# Patient Record
Sex: Male | Born: 1970 | Race: White | Hispanic: No | Marital: Married | Smoking: Never smoker
Health system: Southern US, Community
[De-identification: ages and names within clinical notes are randomized; demographics above are authoritative.]

## PROBLEM LIST (undated history)

## (undated) DIAGNOSIS — R5383 Other fatigue: Secondary | ICD-10-CM

## (undated) HISTORY — PX: MOUTH SURGERY: SHX715

## (undated) HISTORY — PX: WRIST SURGERY: SHX841

## (undated) HISTORY — DX: Other fatigue: R53.83

---

## 2003-01-15 HISTORY — PX: HERNIA REPAIR: SHX51

## 2013-01-14 HISTORY — PX: FOOT SURGERY: SHX648

## 2015-09-10 ENCOUNTER — Encounter (HOSPITAL_COMMUNITY): Payer: Self-pay | Admitting: Emergency Medicine

## 2015-09-10 ENCOUNTER — Ambulatory Visit (HOSPITAL_COMMUNITY)
Admission: EM | Admit: 2015-09-10 | Discharge: 2015-09-10 | Disposition: A | Discharge status: Home / Self Care | Payer: BC Managed Care – PPO | Attending: Nurse Practitioner | Admitting: Nurse Practitioner

## 2015-09-10 DIAGNOSIS — S0101XA Laceration without foreign body of scalp, initial encounter: Secondary | ICD-10-CM
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
## 2015-09-10 NOTE — ED Triage Notes (Signed)
[facility] a [facility] of a laceration to his head that occurred today secondary to hitting it with a piece of metal. The patient stated that he had a TDAP booster about 2 years ago.

## 2015-09-29 ENCOUNTER — Other Ambulatory Visit: Payer: Self-pay | Admitting: Family Medicine

## 2015-09-29 ENCOUNTER — Ambulatory Visit
Admission: RE | Admit: 2015-09-29 | Discharge: 2015-09-29 | Disposition: A | Discharge status: Home / Self Care | Payer: BC Managed Care – PPO | Source: Ambulatory Visit | Attending: Family Medicine | Admitting: Family Medicine

## 2015-09-29 DIAGNOSIS — M25561 Pain in right knee: Secondary | ICD-10-CM

## 2015-10-06 ENCOUNTER — Other Ambulatory Visit: Payer: Self-pay | Admitting: Family Medicine

## 2015-10-06 DIAGNOSIS — R413 Other amnesia: Secondary | ICD-10-CM

## 2015-10-26 ENCOUNTER — Other Ambulatory Visit: Payer: BC Managed Care – PPO

## 2015-11-29 ENCOUNTER — Ambulatory Visit: Payer: BC Managed Care – PPO | Admitting: Neurology

## 2015-12-25 ENCOUNTER — Ambulatory Visit (INDEPENDENT_AMBULATORY_CARE_PROVIDER_SITE_OTHER): Payer: BC Managed Care – PPO | Admitting: Neurology

## 2015-12-25 ENCOUNTER — Encounter: Payer: Self-pay | Admitting: Neurology

## 2015-12-25 VITALS — BP 122/78 | HR 59 | Ht 71.0 in | Wt 192.4 lb

## 2015-12-25 DIAGNOSIS — G4761 Periodic limb movement disorder: Secondary | ICD-10-CM | POA: Diagnosis not present

## 2015-12-25 DIAGNOSIS — R5382 Chronic fatigue, unspecified: Secondary | ICD-10-CM | POA: Diagnosis not present

## 2015-12-25 DIAGNOSIS — F05 Delirium due to known physiological condition: Secondary | ICD-10-CM | POA: Diagnosis not present

## 2015-12-25 DIAGNOSIS — G4719 Other hypersomnia: Secondary | ICD-10-CM | POA: Diagnosis not present

## 2015-12-25 DIAGNOSIS — R4189 Other symptoms and signs involving cognitive functions and awareness: Secondary | ICD-10-CM

## 2015-12-25 DIAGNOSIS — R419 Unspecified symptoms and signs involving cognitive functions and awareness: Secondary | ICD-10-CM | POA: Diagnosis not present

## 2015-12-25 NOTE — Progress Notes (Signed)
[facility]    Provider:  Dr Lucia GaskinsAhern Referring Provider: Tally JoeSwayne, David, MD Primary Care Physician:  Sissy HoffSWAYNE,DAVID W, MD  CC:  Memory loss  HPI:  Steven Daugherty is a 45 y.o. male here as a referral from Dr. Azucena CecilSwayne for memory loss. No other significant past medical history. Denies depression, anxiety. He has had episodes "on and off" for years. 6 years ago he had an awkward episode and for about 10 minutes he couldn't remember her name. Since then he has major "blank outs". He was at a party and for a few minutes he couldn't remember her name, he had to avoid her because he forgot her name and then he remembered but no loss of consciousness, no confusion or alteration of awareness. Recently he was standing in front of a hammock and he couldn't remember the name, he eventually remembered it. Happening more frequently, the last 2 episodes were a month apart. No drug use. No alcohol at the events but he has a beer in the evening and in the weekend. No new medications. He has difficulty sleeping, he is bed for 6-7 hours but he feels like he only sleeps a short period. He goes to be at 1030pm and wakes at 6am, he usually fall asleep quickly, doesn't feel refreshed in the morning, not snoring. Sleep issues off and on for 7-8 years. He is faigued during the day, he denies morning headache. He has changed mattresses, sleeps in the dark, no abnormal movements at night. He forgets something as soon as he is told. He did very well in school. He tries very hard to pay attention. 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 No smoking. No hea dtrauma in the past or any inciting events. No dementia in the family. No FHx of autoimmune disorders or multiple sclerosis. He moves a lot at night, moving his legs a lot. He has good sleep habits and has tried very hard to do things like making the room dark. Keeping the  temperature cool, no alcohol evenings, not eating late and regular sleep habits.   Reviewed notes, labs and imaging from outside physicians, which showed:  CBC normal, CMP normal with BUN 12 and creatinine 0.82. 09/2015  B12 382, TSH 1.56  Reviewed primary care notes. Patient reported trouble sleeping, feeling fatigued and tired all the time, he attributed his memory loss possibly due to his sleeping issues. He reported occasionally temporary forgetfulness of names or people or things. Forgetting names of objects for several minutes. He reported excessive daytime fatigue. He tried making the room dark, bed and was comfortable, her temperature comfortable. He has also tried drinking enough water, no alcohol on the evenings, not eating late, avoiding salt and sugar and caffeine. He feels his stress levels very low, and he gets off the computer screen  at bedtime. A suggestion of a trial of over-the-counter melatonin. He also reported progressive memory loss over 3 years. Not remembering what the name of the town he is in. Could not remember the name of his wife for 15 minutes. Losing names of his good friends. No usage of alcohol or illicit drugs. Long-standing history of fatigue for many years. Worse in the afternoons.    Review of Systems: Patient complains of symptoms per HPI as well as the following symptoms: memory loss, slurred speech, insomnia, fatigue. Pertinent negatives per HPI. All others negative.   Social  History   Social History  . Marital status: Married    Spouse name: Steven Daugherty  . Number of children: 0  . Years of education: 20   Occupational History  . [employer]    Social History Main Topics  . Smoking status: Never Smoker  . Smokeless tobacco: Never Used  . Alcohol use 0.6 oz/week    1 Standard drinks or equivalent per week     Comment: daily- 1 drink  . Drug use: No  . Sexual activity: Not on file   Other Topics Concern  . Not on file   Social History Narrative    Lives with wife   Caffeine use: 2 cups/day- coffee    Family History  Problem Relation Age of Onset  . Dementia Neg Hx     Past Medical History:  Diagnosis Date  . Fatigue     Past Surgical History:  Procedure Laterality Date  . FOOT SURGERY  2015   Arthritis  . HERNIA REPAIR  2005  . 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   No current facility-administered medications for this visit.     Allergies as of 12/25/2015  . (No Known Allergies)    Vitals: BP 122/78 (BP Location: Right Arm, Patient Position: Sitting, Cuff Size: Normal)   Pulse (!) 59   Ht 5\' 11"  (1.803 m)   Wt 192 lb 6.4 oz (87.3 kg)   BMI 26.83 kg/m  Last Weight:  Wt Readings from Last 1 Encounters:  12/25/15 192 lb 6.4 oz (87.3 kg)   Last Height:   Ht Readings from Last 1 Encounters:  12/25/15 5\' 11"  (1.803 m)   Physical exam: Exam: Gen: NAD, conversant, well nourised, obese, well groomed                     CV: RRR, no MRG. No Carotid Bruits. No peripheral edema, warm, nontender Eyes: Conjunctivae clear without exudates or hemorrhage  Neuro: Detailed Neurologic Exam  Speech:    Speech is normal; fluent and spontaneous with normal comprehension.  Cognition:  MMSE - Mini Mental State Exam 12/25/2015  Orientation to time 5  Orientation to Place 5  Registration 3  Attention/ Calculation 5  Recall 3  Language- name 2 objects 2  Language- repeat 1  Language- follow 3 step command 3  Language- read & follow direction 1  Write a sentence 1  Copy design 1  Total score 30      The patient is oriented to person, place, and time;     recent and remote memory intact;     language fluent;     normal attention, concentration,     fund of knowledge Cranial Nerves:    The pupils are equal, round, and reactive to light. The fundi are normal and spontaneous venous pulsations are present. Visual fields are full to finger  confrontation. Extraocular movements are intact. Trigeminal sensation is intact and the muscles of mastication are normal. The face is symmetric. The palate elevates in the midline. Hearing intact. Voice is normal. Shoulder shrug is normal. The tongue has normal motion without fasciculations.   Coordination:    Normal finger to nose and heel to shin. Normal rapid alternating movements.   Gait:    Heel-toe and tandem gait are normal.   Motor Observation:    No asymmetry, no atrophy, and no involuntary movements noted. Tone:    Normal  muscle tone.    Posture:    Posture is normal. normal erect    Strength:    Strength is V/V in the upper and lower limbs.      Sensation: intact to LT     Reflex Exam:  DTR's:    Deep tendon reflexes in the upper and lower extremities are normal bilaterally.   Toes:    The toes are downgoing bilaterally.   Clonus:    Clonus is absent.       Assessment/Plan:  45 year old male with no significant past medical history reporting excessive daytime fatigue and memory changes. Denies any mood disorder.  Excessive daytime fatigue, He also moves a lot at night, moving his legs a lot. He has good sleep habits and has tried very hard to do things like making the room dark. Keeping the temperature cool, no alcohol evenings, not eating late and regular sleep habits. Still he reports never feeling rested. Epworth sleepiness scale 12. We'll refer for sleep evaluation.  Memory loss: MRI of the brain. Likely due to his extreme fatigue.   Cc: Sissy HoffSWAYNE,DAVID W, MD  Naomie DeanAntonia Ahern, MD  [facility] [address] [place], [place] [id number]  Phone [phone] Fax [phone]

## 2015-12-25 NOTE — Patient Instructions (Signed)
Remember to drink plenty of fluid, eat healthy meals and do not skip any meals. Try to eat protein with a every meal and eat a healthy snack such as fruit or nuts in between meals. Try to keep a regular sleep-wake schedule and try to exercise daily, particularly in the form of walking, 20-30 minutes a day, if you can.   As far as diagnostic testing: sleep evaluation for PLMS or other sleep disorder, MRI brain  I would like to see you back in 4-6 months, sooner if we need to. Please call us with any interim questions, concerns, problems, updates or refill requests.   Our phone number is [phone]. We also have an after hours call service for urgent matters and there is a physician on-call for urgent questions. For any emergencies you know to call 911 or go to the nearest emergency room

## 2015-12-26 ENCOUNTER — Encounter: Payer: Self-pay | Admitting: Neurology

## 2015-12-26 DIAGNOSIS — R5383 Other fatigue: Secondary | ICD-10-CM | POA: Insufficient documentation

## 2015-12-26 DIAGNOSIS — R419 Unspecified symptoms and signs involving cognitive functions and awareness: Secondary | ICD-10-CM | POA: Insufficient documentation

## 2016-01-20 ENCOUNTER — Inpatient Hospital Stay: Admission: RE | Admit: 2016-01-20 | Payer: BC Managed Care – PPO | Source: Ambulatory Visit

## 2016-02-08 ENCOUNTER — Ambulatory Visit (INDEPENDENT_AMBULATORY_CARE_PROVIDER_SITE_OTHER): Payer: BC Managed Care – PPO | Admitting: Neurology

## 2016-02-08 ENCOUNTER — Encounter: Payer: Self-pay | Admitting: Neurology

## 2016-02-08 VITALS — BP 118/78 | HR 58 | Resp 20 | Ht 71.0 in | Wt 194.0 lb

## 2016-02-08 DIAGNOSIS — G4719 Other hypersomnia: Secondary | ICD-10-CM | POA: Diagnosis not present
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
## 2016-02-19 ENCOUNTER — Telehealth: Payer: Self-pay

## 2016-02-19 NOTE — Telephone Encounter (Signed)
I called pt to discuss his HLA testing results. (negative-normal).  No answer, left a message asking him to call me back.

## 2016-02-27 NOTE — Telephone Encounter (Signed)
Pt returned my call. I advised him that his HLA test came back negative (normal.) Pt will proceed with sleep studies as discussed and ordered. Pt verbalized understanding of results. Pt had no questions at this time but was encouraged to call back if questions arise.

## 2016-03-04 ENCOUNTER — Ambulatory Visit (INDEPENDENT_AMBULATORY_CARE_PROVIDER_SITE_OTHER): Payer: BC Managed Care – PPO | Admitting: Neurology

## 2016-03-04 DIAGNOSIS — G4710 Hypersomnia, unspecified: Secondary | ICD-10-CM

## 2016-03-04 DIAGNOSIS — G4719 Other hypersomnia: Secondary | ICD-10-CM

## 2016-03-11 ENCOUNTER — Telehealth: Payer: Self-pay | Admitting: Neurology

## 2016-03-11 DIAGNOSIS — G4733 Obstructive sleep apnea (adult) (pediatric): Secondary | ICD-10-CM

## 2016-03-11 DIAGNOSIS — R0683 Snoring: Secondary | ICD-10-CM

## 2016-03-11 DIAGNOSIS — G4710 Hypersomnia, unspecified: Secondary | ICD-10-CM

## 2016-03-11 DIAGNOSIS — G4730 Sleep apnea, unspecified: Principal | ICD-10-CM

## 2016-03-11 DIAGNOSIS — G4713 Recurrent hypersomnia: Secondary | ICD-10-CM

## 2016-03-11 NOTE — Telephone Encounter (Signed)
Will order CPAP titration to see how much apnea is contributing to his hypersomnia.
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
## 2016-03-12 NOTE — Telephone Encounter (Signed)
LM to call back for results

## 2016-03-12 NOTE — Telephone Encounter (Signed)
Please place titration order. Thank you.   I spoke to patient and he is aware of results and recommendations. He is willing to do titration study. I have sent copy of report to PCP.

## 2016-03-12 NOTE — Telephone Encounter (Signed)
Pt returned RN's call °

## 2016-03-12 NOTE — Telephone Encounter (Signed)
-----   Message from Melvyn Novasarmen Dohmeier, MD sent at 03/11/2016 12:25 PM EST ----- 1. Mild- Moderate degree of  Obstructive Sleep Apnea(OSA), this condition may be main cause of EDS as reported, but does not explain the cyclic pattern of occurrence.  OSA has been REM sleep accentuated.  2. Primary Snoring  RECOMMENDATIONS:  1. Advise full-night, attended, CPAP titration study to optimize therapy.  I ordered a return for CPAP titration and will re- evaluate the daytime sleepiness degree after 6 weeks of CPAP use.  2. Positional therapy is advised to reduce snoring. 3. Avoid sedative-hypnotics which may worsen sleep apnea, alcohol and tobacco (as applicable). 4. Advise patient to avoid driving or operating hazardous machinery when sleepy. 5. Further information regarding OSA may be obtained from [employer] (www.sleepfoundation.org) or American Sleep Apnea Association (www.sleepapnea.org). 6. A follow up appointment will be scheduled in the [facility]. The referring provider will be notified of the results.      I certify that I have reviewed the entire raw data recording prior to the issuance of this report in accordance with the Standards of Accreditation of the American Academy of Sleep Medicine (AASM)    Melvyn Novasarmen Dohmeier, MD  03-11-2016  Diplomat, American Board of Psychiatry and Neurology  Diplomat, American Board of Sleep Medicine Medical Director, [facility]

## 2016-03-13 NOTE — Addendum Note (Signed)
Addended by: Melvyn NovasHMEIER, Brianni Manthe on: 03/13/2016 05:27 PM   Modules accepted: Orders

## 2016-03-26 ENCOUNTER — Ambulatory Visit (INDEPENDENT_AMBULATORY_CARE_PROVIDER_SITE_OTHER): Payer: BC Managed Care – PPO | Admitting: Neurology

## 2016-03-26 DIAGNOSIS — G4733 Obstructive sleep apnea (adult) (pediatric): Secondary | ICD-10-CM | POA: Diagnosis not present

## 2016-03-26 DIAGNOSIS — G4713 Recurrent hypersomnia: Secondary | ICD-10-CM

## 2016-03-26 DIAGNOSIS — G4710 Hypersomnia, unspecified: Secondary | ICD-10-CM

## 2016-03-26 DIAGNOSIS — G4730 Sleep apnea, unspecified: Principal | ICD-10-CM

## 2016-03-26 DIAGNOSIS — R0683 Snoring: Secondary | ICD-10-CM

## 2016-03-29 NOTE — Addendum Note (Signed)
Addended by: Melvyn NovasHMEIER, Keefe Zawistowski on: 03/29/2016 12:24 PM   Modules accepted: Orders

## 2016-03-29 NOTE — Procedures (Signed)
PATIENT'S NAME:  Steven Daugherty, Steven Daugherty DOB:      12/25/1970      MR#:    [id number]     DATE OF RECORDING: 03/26/2016 REFERRING M.D.:  Naomie DeanAntonia Ahern MD Study Performed:   CPAP  Titration HISTORY:  Patient had [facility] baseline [facility] on 03/04/16 resulting in an AHI of 18.3/hour ,REM AHI was 24.8 /hour, supine AHI was 17.5. The Wake baseline sp02 saturation was 93%, with the lowest being 86%. Time spent below 89% saturation equaled 6 minutes.  Chef complaint Excessive daytime sleepiness and Fatigue.  The patient endorsed the Epworth Sleepiness Scale at 18/24 points .  The patient's weight 192 pounds with a height of 71 (inches), resulting in a BMI of 26.9 kg/m2.  The patient's neck circumference measured 16 inches.  CURRENT MEDICATIONS: None listed.    PROCEDURE:  This is a multichannel digital polysomnogram utilizing the SomnoStar 11.2 system.  Electrodes and sensors were applied and monitored per AASM Specifications.   EEG, EOG, Chin and Limb EMG, were sampled at 200 Hz.  ECG, Snore and Nasal Pressure, Thermal Airflow, Respiratory Effort, CPAP Flow and Pressure, Oximetry was sampled at 50 Hz. Digital video and audio were recorded.       CPAP was initiated at 5 cmH20 with heated humidity per AASM split night standards and pressure was advanced to 8/8cmH20 because of hypopneas, apneas and desaturations.  At a PAP pressure of 8 cmH20, there was a reduction of the AHI to 0 with improvement of the above symptoms of obstructive sleep apnea.    Lights Out was at 22:47 and Lights On at 05:03. Total recording time (TRT) was 376.5 minutes, with a total sleep time (TST) of 267 minutes. The patient's sleep latency was 64.5 minutes with 37 minutes of wake time after sleep onset. REM latency was 178 minutes.  The sleep efficiency was 70.9 %.    SLEEP ARCHITECTURE: WASO (Wake after sleep onset)  was 36.5 minutes.  There were 10.5 minutes in Stage N1, 215.5 minutes Stage N2, 0 minutes Stage N3 and 41  minutes in Stage REM.  The percentage of Stage N1 was 3.9%, Stage N2 was 80.7%, Stage N3 was 0% and Stage R (REM sleep) was 15.4%.    RESPIRATORY ANALYSIS:  There was a total of 1 respiratory event: 0 apneas and 1 hypopnea 0 respiratory event related arousals (RERAs).     The total APNEA/HYPOPNEA INDEX  (AHI) was 0.2 /hour and the total RESPIRATORY DISTURBANCE INDEX was 0.2 .hour  0 events occurred in REM sleep and 1 events in NREM. The REM AHI was 0 /hour versus a non-REM AHI of 0.3 /hour.  The patient spent 215.5 minutes of total sleep time in the supine position and 52 minutes in non-supine. The supine AHI was 0.3, versus a non-supine AHI of 0.0.  OXYGEN SATURATION & C02:  The baseline 02 saturation was 94%, with the lowest being 92%. Time spent below 89% saturation equaled 0 minutes.  PERIODIC LIMB MOVEMENTS:    The patient had a total of 5 Periodic Limb Movements. The Periodic Limb Movement (PLM) index was 1.1 and the PLM Arousal index was 0 /hour. The arousals were noted as: 27 were spontaneous, 0 were associated with PLMs, and only 1 was associated with respiratory events.  Audio and video analysis did not show any abnormal or unusual movements, behaviors, phonations or vocalizations.  The patient took one bathroom break. Snoring was alleviated under CPAP EKG was in keeping with normal  sinus rhythm (NSR).  DIAGNOSIS  OSA alleviated under CPAPressure of 8 cm water and  heated humidity. The patient was fitted with a Resmed  AirFit  P10 (Large) apparatus.   PLANS/RECOMMENDATIONS: CPAP will be ordered.  The patient is requested to use CPAP for a minimum of 4 hours nightly and return for a clinical evaluation after 30-60 days of use.    A follow up appointment will be scheduled in the Sleep Clinic at [facility].   Please call [phone] with any questions.      I certify that I have reviewed the entire raw data recording prior to the issuance of this report in accordance  with the Standards of Accreditation of the American Academy of Sleep Medicine (AASM)     Melvyn Novas, M.D.  03-29-2016  Diplomat, American Board of Psychiatry and Neurology  Diplomat, American Board of Sleep Medicine Medical Director, [facility]

## 2016-04-04 ENCOUNTER — Telehealth: Payer: Self-pay

## 2016-04-04 NOTE — Telephone Encounter (Signed)
I called pt to discuss his sleep study results. No answer, left a message asking her to call me back. 

## 2016-04-04 NOTE — Telephone Encounter (Signed)
-----   Message from Melvyn Novasarmen Dohmeier, MD sent at 03/29/2016 12:24 PM EDT ----- I have placed a CPAP order for this patient;  DIAGNOSIS  OSA alleviated under CPAPressure of 8 cm water and  heated humidity. The patient was fitted with a Resmed  AirFit  P10 (Large) apparatus.  PLANS/RECOMMENDATIONS: CPAP will be ordered.  The patient is requested to use CPAP for a minimum of 4 hours nightly and return for a clinical evaluation after 30-60 days of use.   A follow up appointment will be scheduled in the Sleep Clinic at [facility].   Please call [phone] with any questions.

## 2016-04-08 NOTE — Telephone Encounter (Signed)
Patient returned call and requested to speak with the nurse who called him, he requested he be called after 1:30. Please call and advise.

## 2016-04-08 NOTE — Telephone Encounter (Signed)
I called pt again, no answer, left a message asking him to call me back. 

## 2016-04-08 NOTE — Telephone Encounter (Signed)
I called pt. I advised him that his sleep study showed that under cpap his osa was alleviated, and therefore, Dr.Dohmeier recommends starting the pt on a cpap. Pt is agreeable to this. I advised him that I would send this order to a DME, [employer], and they will call the pt within a week. Pt had questions about a mask refit and I advised him that the DME handles mask refits. I reviewed cpap compliance expectations with the pt. A follow up appt was made for pt on 07/29/16 at 3:30pm. Pt verbalized understanding of results. Pt had no questions at this time but was encouraged to call back if questions arise.

## 2016-04-23 ENCOUNTER — Telehealth: Payer: Self-pay | Admitting: Neurology

## 2016-04-23 NOTE — Telephone Encounter (Signed)
Called pt and left VM mssg to return call and r/s appt.

## 2016-04-23 NOTE — Telephone Encounter (Signed)
Steven Daugherty, looks like patient was just diagnosed with sleep apnea. I would prefer to followup with him after he has been on the cpap for at least 3 months. Would you call and cancel his appointment tomorrow and have him reschedule with [place] after he has been on the cpap for several months? OSA can cause his symptoms and I want to see how it goes with several months of treatment. Please block the appointment time as well.

## 2016-04-24 ENCOUNTER — Ambulatory Visit: Payer: BC Managed Care – PPO | Admitting: Neurology

## 2016-07-29 ENCOUNTER — Ambulatory Visit: Payer: Self-pay | Admitting: Neurology

## 2016-07-29 ENCOUNTER — Encounter: Payer: Self-pay | Admitting: Neurology

## 2017-06-12 ENCOUNTER — Ambulatory Visit
Admission: RE | Admit: 2017-06-12 | Discharge: 2017-06-12 | Disposition: A | Discharge status: Home / Self Care | Payer: BC Managed Care – PPO | Source: Ambulatory Visit | Attending: Family Medicine | Admitting: Family Medicine

## 2017-06-12 ENCOUNTER — Other Ambulatory Visit: Payer: Self-pay | Admitting: Family Medicine

## 2017-06-12 DIAGNOSIS — R519 Headache, unspecified: Secondary | ICD-10-CM

## 2017-06-12 DIAGNOSIS — R51 Headache: Principal | ICD-10-CM

## 2017-09-02 ENCOUNTER — Emergency Department (HOSPITAL_COMMUNITY)
Admission: EM | Admit: 2017-09-02 | Discharge: 2017-09-03 | Disposition: A | Discharge status: Home / Self Care | Payer: BC Managed Care – PPO | Attending: Emergency Medicine | Admitting: Emergency Medicine

## 2017-09-02 ENCOUNTER — Encounter (HOSPITAL_COMMUNITY): Payer: Self-pay | Admitting: Emergency Medicine

## 2017-09-02 DIAGNOSIS — Y929 Unspecified place or not applicable: Secondary | ICD-10-CM | POA: Insufficient documentation

## 2017-09-02 DIAGNOSIS — Y998 Other external cause status: Secondary | ICD-10-CM | POA: Insufficient documentation

## 2017-09-02 DIAGNOSIS — Y9389 Activity, other specified: Secondary | ICD-10-CM | POA: Insufficient documentation

## 2017-09-02 DIAGNOSIS — S0181XA Laceration without foreign body of other part of head, initial encounter: Secondary | ICD-10-CM | POA: Insufficient documentation

## 2017-09-02 DIAGNOSIS — W228XXA Striking against or struck by other objects, initial encounter: Secondary | ICD-10-CM | POA: Diagnosis not present

## 2017-09-02 NOTE — ED Triage Notes (Signed)
Patient presents with approx. 1" laceration at forehead above left eyebrow sustained this evening when he accidentally hit it against the corner of a cabinet. Dressing applied at triage .

## 2017-09-03 NOTE — Discharge Instructions (Addendum)
The dermabond will wash away in about 5 days. You can shower with the dermabond.   Return to the ER if you have any new or concerning symptoms like fever, redness or swelling near the cut.

## 2017-09-03 NOTE — ED Provider Notes (Signed)
[facility] EM[facility]ENC[facility] Provider Note   CSN: [id number] Arrival date & time: 09/02/17  2256     History   Chief Complaint Chief Complaint  Patient presents with  . Head Laceration    HPI Steven Daugherty is a 47 y.o. male.  HPI  Steven Daugherty is a 47 year old male with no significant past medical history who presents to the emergency department for evaluation of left forehead laceration.  Patient reports that he was standing up and accidentally hit his forehead against the corner of a cabinet.  This occurred just prior to arrival.  Bleeding was controlled with pressure.  He denies pain over the laceration.  Denies headache, visual disturbance, neck pain, vomiting.  Does not take any blood thinners.  Reports his last tetanus vaccine was 3 years ago.  Past Medical History:  Diagnosis Date  . Fatigue     Patient Active Problem List   Diagnosis Date Noted  . Fatigue 12/26/2015  . Cognitive complaints 12/26/2015    Past Surgical History:  Procedure Laterality Date  . FOOT SURGERY  2015   Arthritis  . HERNIA REPAIR  2005  . 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 Dementia Neg Hx     Social History Social History   Tobacco Use  . Smoking status: Never Smoker  . Smokeless tobacco: Never Used  Substance Use Topics  . Alcohol use: Yes    Alcohol/week: 1.0 standard drinks    Types: 1 Standard drinks or equivalent per week    Comment: daily- 1 drink  . Drug use: No     Allergies   Patient has no known allergies.   Review of Systems Review of Systems  Constitutional: Negative for chills and fever.  Eyes: Negative for visual disturbance.  Gastrointestinal: Negative for vomiting.  Skin: Positive for wound. Negative for color change.  Neurological: Negative for  weakness, numbness and headaches.     Physical Exam Updated Vital Signs BP (!) 142/98 (BP Location: Right Arm)   Pulse (!) 59   Temp 98.8 F (37.1 C) (Oral)   Resp 16   SpO2 100%   Physical Exam  Constitutional: He appears well-developed and well-nourished. No distress.  HENT:  Head: Normocephalic and atraumatic.  Left forehead with straight 2 cm superficial laceration.  No surrounding erythema, warmth or swelling.  Nontender to palpation.  No periorbital tenderness.  EOMs full.  Eyes: Pupils are equal, round, and reactive to light. Conjunctivae and EOM are normal. Right eye exhibits no discharge. Left eye exhibits no discharge.  Neck: Normal range of motion. Neck supple.  Pulmonary/Chest: Effort normal. No respiratory distress.  Neurological: He is alert. Coordination normal.  Skin: He is not diaphoretic.  Psychiatric: He has a normal mood and affect. His behavior is normal.  Nursing note and vitals reviewed.    ED Treatments / Results  Labs (all labs ordered are listed, but only abnormal results are displayed) Labs Reviewed - No data to display  EKG None  Radiology No results found.  Procedures .Marland Kitchen.Laceration Repair Date/Time: 09/03/2017 12:18 AM Performed by: Kellie ShropshireShrosbree, Aubrianna Orchard J, PA-C Authorized by: Kellie ShropshireShrosbree, Margues Filippini J, PA-C   Consent:    Consent obtained:  Verbal   Consent given by:  Patient   Risks discussed:  Infection, pain, poor cosmetic result and poor wound healing   Alternatives discussed:  No treatment Laceration details:    Location:  Face   Face location:  Forehead   Length (cm):  2   Depth (mm):  1 Repair type:    Repair type:  Simple Pre-procedure details:    Preparation:  Patient was prepped and draped in usual sterile fashion Exploration:    Hemostasis achieved with:  Direct pressure   Wound exploration: wound explored through full range of motion and entire depth of wound probed and visualized     Contaminated: no   Treatment:    Area  cleansed with:  Betadine   Amount of cleaning:  Standard   Irrigation solution:  Sterile saline   Irrigation volume:  50ccs   Irrigation method:  Pressure wash Skin repair:    Repair method:  Tissue adhesive Approximation:    Approximation:  Close Post-procedure details:    Dressing:  Open (no dressing)   (including critical care time)  Medications Ordered in ED Medications - No data to display   Initial Impression / Assessment and Plan / ED Course  I have reviewed the triage vital signs and the nursing notes.  Pertinent labs & imaging results that were available during my care of the patient were reviewed by me and considered in my medical decision making (see chart for details).     Patient with superficial forehead laceration which was closed with Dermabond.  He reports that his Tdap is up-to-date.  Counseled him to follow-up if signs of infection and he agrees and appears reliable.  Final Clinical Impressions(s) / ED Diagnoses   Final diagnoses:  None    ED Discharge Orders    None       Lawrence MarseillesShrosbree, Manaia Samad J, PA-C 09/03/17 0032    Lorre NickAllen, Anthony, MD 09/04/17 1116

## 2019-02-22 ENCOUNTER — Ambulatory Visit: Payer: BC Managed Care – PPO | Admitting: Internal Medicine

## 2019-03-08 ENCOUNTER — Encounter (INDEPENDENT_AMBULATORY_CARE_PROVIDER_SITE_OTHER): Payer: Self-pay

## 2019-03-08 ENCOUNTER — Ambulatory Visit: Payer: BC Managed Care – PPO | Admitting: Internal Medicine

## 2019-03-08 ENCOUNTER — Other Ambulatory Visit: Payer: Self-pay

## 2019-03-08 ENCOUNTER — Encounter: Payer: Self-pay | Admitting: Internal Medicine

## 2019-03-08 VITALS — BP 116/75 | HR 89 | Temp 97.9°F | Ht 71.0 in | Wt 190.0 lb

## 2019-03-08 DIAGNOSIS — R079 Chest pain, unspecified: Secondary | ICD-10-CM | POA: Diagnosis not present

## 2019-03-08 DIAGNOSIS — E782 Mixed hyperlipidemia: Secondary | ICD-10-CM | POA: Diagnosis not present

## 2019-03-08 DIAGNOSIS — G4733 Obstructive sleep apnea (adult) (pediatric): Secondary | ICD-10-CM | POA: Diagnosis not present

## 2019-03-08 NOTE — Progress Notes (Signed)
Cardiology Office Note:    Date:  03/08/2019   ID:  Steven Daugherty, DOB [birth year], MRN [id number]  PCP:  Parke Poisson, MD  Cardiologist:  No primary care provider on file.  Electrophysiologist:  None   Referring MD: Tally Joe, MD   Chief Complaint: Chest pain  History of Present Illness:    Steven Daugherty is a 49 y.o. male with a history of HLD and family history of CAD who presents for chest tightness both at rest and with exertion. He describes two separate phenomena. Has been occurring for 1 year.  He describes a sharp pain in specific place on the left side of his chest that will wake him up at night. Sharp with deep inspiration. Lasts 5-10 mins, can also occur in the middle of day or while driving his car as well. This symptom does not occur with exertion.   He has a separate symptom of chest pressure and tightness that occurs with exertion - on mountain bike, tight in chest left sided pressure/can't fully breath on that side. Starts while running, stair climbing, biking, 5-10 mins onset and 15 min recovery. Left of substernal, pressure, nonradiating. Improves with rest.   Wakes up in middle of night to yawn a lot. Occasional snoring. Has had sleep test - has OSA, score 22-25, had cpap 1 year, couldn't use it. Unable to tolerate.   Second hand smoke exposure as a child, never smoker.  No substance use.  HLD - last lipid panel 2018. LDL 153. Has made significant diet and lifestyle modifications.  Fhx: Father SCD 2 Uncle and grandfather with heart disease.    Past Medical History:  Diagnosis Date  . Fatigue     Past Surgical History:  Procedure Laterality Date  . FOOT SURGERY  2015   Arthritis  . HERNIA REPAIR  2005  . 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     Allergies:   Patient has  no known allergies.   Social History   Socioeconomic History  . Marital status: Married    Spouse name: Steven Daugherty  . Number of children: 0  . Years of education: 20  . Highest education level: Not on file  Occupational History  . Occupation: [employer]  Tobacco Use  . Smoking status: Never Smoker  . Smokeless tobacco: Never Used  Substance and Sexual Activity  . Alcohol use: Yes    Alcohol/week: 1.0 standard drinks    Types: 1 Standard drinks or equivalent per week    Comment: daily- 1 drink  . Drug use: No  . Sexual activity: Not on file  Other Topics Concern  . Not on file  Social History Narrative   Lives with wife   Caffeine use: 2 cups/day- coffee   Social Determinants of Health   Financial Resource Strain:   . Difficulty of Paying Living Expenses: Not on file  Food Insecurity:   . Worried About Programme researcher, broadcasting/film/video in the Last Year: Not on file  . Ran Out of Food in the Last Year: Not on file  Transportation Needs:   . Lack of Transportation (Medical): Not on file  . Lack of Transportation (Non-Medical): Not on file  Physical Activity:   . Days of Exercise per Week: Not on file  . Minutes of Exercise per Session: Not on file  Stress:   . Feeling of Stress : Not on file  Social Connections:   . Frequency of Communication with Friends and Family: Not on file  . Frequency of Social Gatherings with Friends and Family: Not on file  . Attends Religious Services: Not on file  . Active Member of Clubs or Organizations: Not on file  . Attends Archivist Meetings: Not on file  . Marital Status: Not on file     Family History: The patient's family history is negative for Dementia.  ROS:   Please see the history of present illness.    All other systems reviewed and are negative.  EKGs/Labs/Other Studies Reviewed:    The following studies were reviewed today:  EKG:  Marked sinus brady, rate 44  Recent Labs: No results found for requested labs within last 8760  hours.  Recent Lipid Panel No results found for: CHOL, TRIG, HDL, CHOLHDL, VLDL, LDLCALC, LDLDIRECT  Physical Exam:    VS:  BP 116/75   Pulse 89   Temp 97.9 F (36.6 C)   Ht 5\' 11"  (1.803 m)   Wt 190 lb (86.2 kg)   SpO2 97%   BMI 26.50 kg/m     Wt Readings from Last 5 Encounters:  03/08/19 190 lb (86.2 kg)  02/08/16 194 lb (88 kg)  12/25/15 192 lb 6.4 oz (87.3 kg)  09/10/15 190 lb (86.2 kg)     Constitutional: No acute distress Eyes: sclera non-icteric, normal conjunctiva and lids ENMT: normal dentition, moist mucous membranes Cardiovascular: regular rhythm, normal rate, no murmurs. S1 and S2 normal. Radial pulses normal bilaterally. No jugular venous distention.  Respiratory: clear to auscultation bilaterally GI : normal bowel sounds, soft and nontender. No distention.   MSK: extremities warm, well perfused. No edema.  NEURO: grossly nonfocal exam, moves all extremities. PSYCH: alert and oriented x 3, normal mood and affect.   ASSESSMENT:    1. Chest pain, unspecified type   2. Mixed hyperlipidemia   3. OSA (obstructive sleep apnea)    PLAN:    Chest pain - two separate events occurring. The chest pressure in the setting of hyperlipidemia and family history of CAD is concerning, will need to rule out obstructive CAD. To ensure coronary origins and course are normal, as well as evaluate luminal stenosis, we will obtain a CCTA. HR is optimized for this test. Will also obtain an echocardiogram for the other chest sensation, which sounds more pleuritic. He has untreated sleep apnea, will need to evaluate pulmonary pressures.   HLD - we will perform a lipid panel to evaluate after his significant lifestyle changes.   OSA -reviewed indications for treatment and natural history of OSA.   Total time of encounter: 60 minutes total time of encounter, including 35 minutes spent in face-to-face patient care. This time includes coordination of care and counseling regarding above  mentioned problem list. Remainder of non-face-to-face time involved reviewing chart documents/testing relevant to the patient encounter and documentation in the medical record. I have independently reviewed documentation from referring provider, including approximately 10 pages of outside records.  Cherlynn Kaiser, MD   [facility]    Medication Adjustments/Labs and Tests Ordered: Current medicines are reviewed at length with the patient today.  Concerns regarding medicines are outlined above.  Orders Placed This Encounter  Procedures  . CT CORONARY MORPH W/CTA COR W/SCORE W/CA W/CM &/OR WO/CM  . CT CORONARY FRACTIONAL FLOW RESERVE DATA PREP  . CT CORONARY FRACTIONAL FLOW RESERVE FLUID  ANALYSIS  . Basic metabolic panel  . Lipid panel  . EKG 12-Lead  . ECHOCARDIOGRAM COMPLETE   No orders of the defined types were placed in this encounter.   Patient Instructions  Medication Instructions:  No changes   *If you need a refill on your cardiac medications before your next appointment, please call your pharmacy*  Lab Work: Lipid- fasting   see other  Instructions sheet If you have labs (blood work) drawn today and your tests are completely normal, you will receive your results only by: Marland Kitchen MyChart Message (if you have MyChart) OR . A paper copy in the mail If you have any lab test that is abnormal or we need to change your treatment, we will call you to review the results.  Testing/Procedures: Will be schedule at [facility] [address] Your physician has requested that you have an echocardiogram. Echocardiography is a painless test that uses sound waves to create images of your heart. It provides your doctor with information about the size and shape of your heart and how well your heart's chambers and valves are working. This procedure takes approximately one hour. There are no restrictions for this procedure.  And Will be schedule at [facility] -Radiology  department once  Authorized by insurance. Your physician has requested that you have cardiac CT. Cardiac computed tomography (CT) is a painless test that uses an x-ray machine to take clear, detailed pictures of your heart. For further information please visit [email or web address] Please follow instruction sheet as given.     Follow-Up: At [facility], you and your health needs are our priority.  As part of our continuing mission to provide you with exceptional heart care, we have created designated Provider Care Teams.  These Care Teams include your primary Cardiologist (physician) and Advanced Practice Providers (APPs -  Physician Assistants and Nurse Practitioners) who all work together to provide you with the care you need, when you need it.  Your next appointment:   2 month(s)  The format for your next appointment:   In Person  Provider:   Weston Brass, MD  Other Instructions N/a  Your cardiac CT will be scheduled at one of the below locations:   [facility] [address] [place], [place] [address] [phone]  If scheduled at [facility], please arrive at the [facility] main entrance of [facility] 30 minutes prior to test start time. Proceed to the [facility] Radiology Department (first floor) to check-in and test prep.    Please follow these instructions carefully (unless otherwise directed):  Hold all erectile dysfunction medications at least 3 days (72 hrs) prior to test.  Please have labs done at least week  Prior to test.   On the Night Before the Test: . Be sure to Drink plenty of water. . Do not consume any caffeinated/decaffeinated beverages or chocolate 12 hours prior to your test. . Do not take any antihistamines 12 hours prior to your test.   On the Day of the Test: . Drink plenty of water. Do not drink any water within one hour of the test. . Do not eat any food 4 hours prior to the test. . You may take your  regular medications prior to the test.  . No  Beta Blocker need prior to test. . HOLD Furosemide/Hydrochlorothiazide morning of the test.         After the Test: . Drink plenty of water. . After receiving IV  contrast, you may experience a mild flushed feeling. This is normal. . On occasion, you may experience a mild rash up to 24 hours after the test. This is not dangerous. If this occurs, you can take Benadryl 25 mg and increase your fluid intake. . If you experience trouble breathing, this can be serious. If it is severe call 911 IMMEDIATELY. If it is mild, please call our office.    Once we have confirmed authorization from your insurance company, we will call you to set up a date and time for your test.   For non-scheduling related questions, please contact the cardiac imaging nurse navigator should you have any questions/concerns: Rockwell Alexandria, RN Navigator Cardiac Imaging [facility] [phone] mobile

## 2019-03-08 NOTE — Patient Instructions (Addendum)
Medication Instructions:  No changes   *If you need a refill on your cardiac medications before your next appointment, please call your pharmacy*  Lab Work: Lipid- fasting   see other  Instructions sheet If you have labs (blood work) drawn today and your tests are completely normal, you will receive your results only by: Marland Kitchen MyChart Message (if you have MyChart) OR . A paper copy in the mail If you have any lab test that is abnormal or we need to change your treatment, we will call you to review the results.  Testing/Procedures: Will be schedule at [facility] has requested that you have an echocardiogram. Echocardiography is a painless test that uses sound waves to create images of your heart. It provides your doctor with information about the size and shape of your heart and how well your heart's chambers and valves are working. This procedure takes approximately one hour. There are no restrictions for this procedure.  And Will be schedule at [facility] once  Authorized by insurance. Your physician has requested that you have cardiac CT. Cardiac computed tomography (CT) is a painless test that uses an x-ray machine to take clear, detailed pictures of your heart. For further information please visit [email or web address]. Please follow instruction sheet as given.     Follow-Up: At [facility], you and your health needs are our priority.  As part of our continuing mission to provide you with exceptional heart care, we have created designated Provider Care Teams.  These Care Teams include your primary Cardiologist (physician) and Advanced Practice Providers (APPs -  Physician Assistants and Nurse Practitioners) who all work together to provide you with the care you need, when you need it.  Your next appointment:   2 month(s)  The format for your next appointment:   In Person  Provider:   Cherlynn Kaiser, MD  Other  Instructions N/a  Your cardiac CT will be scheduled at one of the below locations:   [facility] [address] [place], [place] [address] [phone]  If scheduled at [facility], please arrive at the [facility] main entrance of [facility] 30 minutes prior to test start time. Proceed to the [facility] Radiology Department (first floor) to check-in and test prep.    Please follow these instructions carefully (unless otherwise directed):  Hold all erectile dysfunction medications at least 3 days (72 hrs) prior to test.  Please have labs done at least week  Prior to test.   On the Night Before the Test: . Be sure to Drink plenty of water. . Do not consume any caffeinated/decaffeinated beverages or chocolate 12 hours prior to your test. . Do not take any antihistamines 12 hours prior to your test.   On the Day of the Test: . Drink plenty of water. Do not drink any water within one hour of the test. . Do not eat any food 4 hours prior to the test. . You may take your regular medications prior to the test.  . No  Beta Blocker need prior to test. . HOLD Furosemide/Hydrochlorothiazide morning of the test.         After the Test: . Drink plenty of water. . After receiving IV contrast, you may experience a mild flushed feeling. This is normal. . On occasion, you may experience a mild rash up to 24 hours after the test. This is not dangerous. If this occurs, you can take  Benadryl 25 mg and increase your fluid intake. . If you experience trouble breathing, this can be serious. If it is severe call 911 IMMEDIATELY. If it is mild, please call our office.    Once we have confirmed authorization from your insurance company, we will call you to set up a date and time for your test.   For non-scheduling related questions, please contact the cardiac imaging nurse navigator should you have any questions/concerns: Rockwell Alexandria, RN Navigator Cardiac  Imaging [facility] [phone] mobile

## 2019-03-19 ENCOUNTER — Ambulatory Visit (HOSPITAL_COMMUNITY): Payer: BC Managed Care – PPO | Attending: Internal Medicine

## 2019-03-19 ENCOUNTER — Telehealth (HOSPITAL_COMMUNITY): Payer: Self-pay | Admitting: Emergency Medicine

## 2019-03-19 ENCOUNTER — Other Ambulatory Visit: Payer: Self-pay

## 2019-03-19 DIAGNOSIS — R079 Chest pain, unspecified: Secondary | ICD-10-CM | POA: Insufficient documentation

## 2019-03-19 NOTE — Telephone Encounter (Signed)
Left message on voicemail with name and callback number Masiah Lewing RN Navigator Cardiac Imaging [facility] [phone] Office [phone] Cell  

## 2019-03-22 ENCOUNTER — Encounter (HOSPITAL_COMMUNITY): Payer: Self-pay

## 2019-03-22 ENCOUNTER — Other Ambulatory Visit: Payer: Self-pay

## 2019-03-22 ENCOUNTER — Telehealth (HOSPITAL_COMMUNITY): Payer: Self-pay | Admitting: Emergency Medicine

## 2019-03-22 ENCOUNTER — Ambulatory Visit (HOSPITAL_COMMUNITY)
Admission: RE | Admit: 2019-03-22 | Discharge: 2019-03-22 | Disposition: A | Discharge status: Home / Self Care | Payer: BC Managed Care – PPO | Source: Ambulatory Visit | Attending: Internal Medicine | Admitting: Internal Medicine

## 2019-03-22 DIAGNOSIS — R079 Chest pain, unspecified: Secondary | ICD-10-CM

## 2019-03-22 LAB — BASIC METABOLIC PANEL
BUN/Creatinine Ratio: 11 (ref 9–20)
BUN: 8 mg/dL (ref 6–24)
CO2: 25 mmol/L (ref 20–29)
Calcium: 9.3 mg/dL (ref 8.7–10.2)
Chloride: 102 mmol/L (ref 96–106)
Creatinine, Ser: 0.76 mg/dL (ref 0.76–1.27)
GFR calc Af Amer: 122 mL/min/{1.73_m2} (ref >59)
GFR calc non Af Amer: 106 mL/min/{1.73_m2} (ref >59)
Glucose: 89 mg/dL (ref 65–99)
Potassium: 4.2 mmol/L (ref 3.5–5.2)
Sodium: 140 mmol/L (ref 134–144)

## 2019-03-22 LAB — LIPID PANEL
Chol/HDL Ratio: 3.9 ratio (ref 0.0–5.0)
Cholesterol, Total: 170 mg/dL (ref 100–199)
HDL: 44 mg/dL (ref >39)
LDL Chol Calc (NIH): 112 mg/dL — ABNORMAL HIGH (ref 0–99)
Triglycerides: 76 mg/dL (ref 0–149)
VLDL Cholesterol Cal: 14 mg/dL (ref 5–40)

## 2019-03-22 MED ORDER — IOHEXOL 350 MG/ML SOLN
80.0000 mL | Freq: Once | INTRAVENOUS | Status: AC | PRN
  Administered 2019-03-22: 80 mL via INTRAVENOUS

## 2019-03-22 MED ORDER — NITROGLYCERIN 0.4 MG SL SUBL
SUBLINGUAL_TABLET | SUBLINGUAL | Status: AC
  Filled 2019-03-22: qty 2

## 2019-03-22 MED ORDER — NITROGLYCERIN 0.4 MG SL SUBL
0.8000 mg | SUBLINGUAL_TABLET | Freq: Once | SUBLINGUAL | Status: AC
  Administered 2019-03-22: 0.8 mg via SUBLINGUAL

## 2019-03-22 NOTE — Telephone Encounter (Signed)
Pt calling regarding medications to take pre-CCTA. States he was told to pick up an rx for metoprolol.  I informed him this is false information. That his last ECG showed a HR of 44 sinus brady. He does not need a dose of metoprolol prior to CCTA. Pt appreciated the info and glad he will not need to postpone test this morning.  Rockwell Alexandria RN Navigator Cardiac Imaging [facility] [phone] Office  [phone] Cell

## 2019-03-24 ENCOUNTER — Telehealth: Payer: Self-pay | Admitting: *Deleted

## 2019-03-24 NOTE — Telephone Encounter (Signed)
Left detailed message per dpr - on voicemail- may review result through mychart --  any question may call back

## 2019-03-24 NOTE — Telephone Encounter (Signed)
-----   Message from Parke Poisson, MD sent at 03/23/2019  7:44 AM EST ----- No evidence of coronary disease. No significant incidental findings.

## 2019-03-24 NOTE — Telephone Encounter (Signed)
-----   Message from Parke Poisson, MD sent at 03/23/2019  7:45 AM EST ----- Echo shows normal pump function, and mild impairment of left ventricle relaxation. No pulmonary hypertension. Reassuring findings.

## 2019-05-06 ENCOUNTER — Telehealth: Payer: BC Managed Care – PPO | Admitting: Internal Medicine

## 2019-05-10 ENCOUNTER — Telehealth: Payer: BC Managed Care – PPO | Admitting: Internal Medicine

## 2019-05-10 ENCOUNTER — Telehealth: Payer: Self-pay

## 2019-05-10 NOTE — Telephone Encounter (Signed)
Attempted to reach patient multiple times for virtual visit this morning. No answer. Patient will need to reschedule with Dr. Jacques Navy for another day.

## 2019-06-04 ENCOUNTER — Telehealth (INDEPENDENT_AMBULATORY_CARE_PROVIDER_SITE_OTHER): Payer: BC Managed Care – PPO | Admitting: Internal Medicine

## 2019-06-04 ENCOUNTER — Telehealth: Payer: Self-pay | Admitting: *Deleted

## 2019-06-04 ENCOUNTER — Encounter: Payer: Self-pay | Admitting: Internal Medicine

## 2019-06-04 ENCOUNTER — Encounter: Payer: Self-pay | Admitting: *Deleted

## 2019-06-04 VITALS — Ht 70.5 in

## 2019-06-04 DIAGNOSIS — E782 Mixed hyperlipidemia: Secondary | ICD-10-CM | POA: Diagnosis not present

## 2019-06-04 DIAGNOSIS — R079 Chest pain, unspecified: Secondary | ICD-10-CM | POA: Diagnosis not present

## 2019-06-04 NOTE — Telephone Encounter (Signed)
Patient had video visit with Dr Jacques Navy today and needed to f/u 9/20 Unable to reach patient but left message to call back and will send mychart message if not ok with date and time of appointment to call

## 2019-06-04 NOTE — Progress Notes (Signed)
Virtual Visit via Video Note   This visit type was conducted due to national recommendations for restrictions regarding the COVID-19 Pandemic (e.g. social distancing) in an effort to limit this patient's exposure and mitigate transmission in our community.  Due to his co-morbid illnesses, this patient is at least at moderate risk for complications without adequate follow up.  This format is felt to be most appropriate for this patient at this time.  All issues noted in this document were discussed and addressed.  A limited physical exam was performed with this format.  Please refer to the patient's chart for his consent to telehealth for [facility].   The patient was identified using 2 identifiers.  Date:  06/04/2019   ID:  Steven Daugherty, DOB July 11, 1970, MRN [id number]  Patient Location: Home Provider Location: Office  PCP:  No primary care provider on file.  Cardiologist:  Elouise Munroe, MD  Electrophysiologist:  None   Evaluation Performed:  Follow-Up Visit  Chief Complaint:  F/u chest pain  History of Present Illness:    Steven Daugherty is a 49 y.o. male with HLD and family history of CAD who presents for follow up of chest tightness both at rest and with exertion.  We discussed reassuring test results including CCTA which showed no CAD, no coronary calcium.    CCTA: IMPRESSION: 1. No evidence of CAD, CADRADS = 0.   2. Coronary calcium score of 0. This was 0 percentile for age and sex matched control.   3. Normal coronary origin with right dominance.   4. 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  Echo:   1. Left ventricular ejection fraction, by estimation, is 55 to 60%. The  left ventricle has normal function. The left ventricle has no regional  wall motion abnormalities. Left ventricular diastolic parameters are  consistent with Grade I diastolic  dysfunction (impaired relaxation).   2. Right ventricular systolic function is normal. The right  ventricular  size is normal. There is normal pulmonary artery systolic pressure.   3. The mitral valve is normal in structure and function. No evidence of  mitral valve regurgitation.   4. The aortic valve is normal in structure and function. Aortic valve  regurgitation is trivial.   5. The inferior vena cava is normal in size with greater than 50%  respiratory variability, suggesting right atrial pressure of 3 mmHg.   He is overall feeling well without significant concern and has not had a recurrence of symptoms.  The patient does not have symptoms concerning for COVID-19 infection (fever, chills, cough, or new shortness of breath).    Past Medical History:  Diagnosis Date  . Fatigue    Past Surgical History:  Procedure Laterality Date  . FOOT SURGERY  2015   Arthritis  . HERNIA REPAIR  2005  . 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     Allergies:   Patient has no known allergies.   Social History   Tobacco Use  . Smoking status: Never Smoker  . Smokeless tobacco: Never Used  Substance Use Topics  . Alcohol use: Yes    Alcohol/week: 1.0 standard drinks    Types: 1 Standard drinks or equivalent per week    Comment: daily- 1 drink  . Drug use: No     Family Hx: The patient's family  history is negative for Dementia.  ROS:   Please see the history of present illness.     All other systems reviewed and are negative.   Prior CV studies:   The following studies were reviewed today:  Echo, CCTA  Labs/Other Tests and Data Reviewed:    EKG:  No ECG reviewed.  Recent Labs: 03/22/2019: BUN 8; Creatinine, Ser 0.76; Potassium 4.2; Sodium 140   Recent Lipid Panel Lab Results  Component Value Date/Time   CHOL 170 03/22/2019 08:39 AM   TRIG 76 03/22/2019 08:39 AM   HDL 44 03/22/2019 08:39 AM   CHOLHDL 3.9 03/22/2019  08:39 AM   LDLCALC 112 (H) 03/22/2019 08:39 AM    Wt Readings from Last 3 Encounters:  03/08/19 190 lb (86.2 kg)  02/08/16 194 lb (88 kg)  12/25/15 192 lb 6.4 oz (87.3 kg)     Objective:    Vital Signs:  Ht 5' 10.5" (1.791 m)   BMI 26.88 kg/m    VITAL SIGNS:  reviewed GEN:  no acute distress EYES:  sclerae anicteric, EOMI - Extraocular Movements Intact RESPIRATORY:  normal respiratory effort, symmetric expansion CARDIOVASCULAR:  no peripheral edema SKIN:  no rash, lesions or ulcers. MUSCULOSKELETAL:  no obvious deformities. NEURO:  alert and oriented x 3, no obvious focal deficit PSYCH:  normal affect  ASSESSMENT & PLAN:    1. Chest pain, unspecified type   2. Mixed hyperlipidemia    Chest pain - improved. We will monitor symptoms. Discussed indications for sublingual nitroglycerin if he chooses to use this however we can closely observe without therapy at this time.  HLD - discuss diet and lifestyle modification in detail.   COVID-19 Education: The signs and symptoms of COVID-19 were discussed with the patient and how to seek care for testing (follow up with PCP or arrange E-visit).  The importance of social distancing was discussed today.  Time:   Today, I have spent 25 minutes with the patient with telehealth technology discussing the above problems.     Medication Adjustments/Labs and Tests Ordered: Current medicines are reviewed at length with the patient today.  Concerns regarding medicines are outlined above.   Tests Ordered: No orders of the defined types were placed in this encounter.   Medication Changes: No orders of the defined types were placed in this encounter.   Follow Up: 3-4 mo  Signed, Parke Poisson, MD  06/04/2019 8:39 AM    [facility]

## 2019-06-04 NOTE — Patient Instructions (Addendum)
Medication Instructions:  Your physician recommends that you continue on your current medications as directed. Please refer to the Current Medication list given to you today.  *If you need a refill on your cardiac medications before your next appointment, please call your pharmacy*  Lab Work: NONE   Testing/Procedures: NONE   Follow-Up: At [facility], you and your health needs are our priority.  As part of our continuing mission to provide you with exceptional heart care, we have created designated Provider Care Teams.  These Care Teams include your primary Cardiologist (physician) and Advanced Practice Providers (APPs -  Physician Assistants and Nurse Practitioners) who all work together to provide you with the care you need, when you need it.  We recommend signing up for the patient portal called "MyChart".  Sign up information is provided on this After Visit Summary.  MyChart is used to connect with patients for Virtual Visits (Telemedicine).  Patients are able to view lab/test results, encounter notes, upcoming appointments, etc.  Non-urgent messages can be sent to your provider as well.   To learn more about what you can do with MyChart, go to [email or web address].    Your next appointment:    10/04/19 AT  8:00 AM WITH DR Jacques Navy

## 2019-10-04 ENCOUNTER — Ambulatory Visit: Payer: BC Managed Care – PPO | Admitting: Internal Medicine

## 2019-11-04 ENCOUNTER — Encounter: Payer: Self-pay | Admitting: Internal Medicine

## 2019-11-04 ENCOUNTER — Other Ambulatory Visit: Payer: Self-pay

## 2019-11-04 ENCOUNTER — Ambulatory Visit (INDEPENDENT_AMBULATORY_CARE_PROVIDER_SITE_OTHER): Payer: BC Managed Care – PPO | Admitting: Internal Medicine

## 2019-11-04 VITALS — BP 126/82 | HR 46 | Ht 70.0 in | Wt 181.8 lb

## 2019-11-04 DIAGNOSIS — R079 Chest pain, unspecified: Secondary | ICD-10-CM | POA: Diagnosis not present

## 2019-11-04 DIAGNOSIS — E782 Mixed hyperlipidemia: Secondary | ICD-10-CM

## 2019-11-04 NOTE — Patient Instructions (Signed)
Medication Instructions:  No Changes In Medications at this time.  *If you need a refill on your cardiac medications before your next appointment, please call your pharmacy*  Lab Work: None Ordered At This Time.  If you have labs (blood work) drawn today and your tests are completely normal, you will receive your results only by: . MyChart Message (if you have MyChart) OR . A paper copy in the mail If you have any lab test that is abnormal or we need to change your treatment, we will call you to review the results.  Testing/Procedures: None Ordered At This Time.   Follow-Up: At [facility], you and your health needs are our priority.  As part of our continuing mission to provide you with exceptional heart care, we have created designated Provider Care Teams.  These Care Teams include your primary Cardiologist (physician) and Advanced Practice Providers (APPs -  Physician Assistants and Nurse Practitioners) who all work together to provide you with the care you need, when you need it.  Your next appointment:   AS NEEDED   The format for your next appointment:   In Person  Provider:   Gayatri Acharya, MD 

## 2019-11-04 NOTE — Progress Notes (Signed)
Cardiology Office Note:    Date:  11/04/2019   ID:  Steven Daugherty, DOB 01-30-1970, MRN [id number]  PCP:  Tally Joe, MD  Cardiologist:  Parke Poisson, MD  Electrophysiologist:  None   Referring MD: Parke Poisson, MD   Chief Complaint/Reason for Referral: Follow up chest pain  History of Present Illness:    Steven Daugherty is a 49 y.o. male with a history of HLD and family history of CAD who presents for follow up of chest tightness both at rest and with exertion.  No recurrence of symptoms. The patient denies chest pain, chest pressure, dyspnea at rest or with exertion, palpitations, PND, orthopnea, or leg swelling. Denies cough, fever, chills. Denies nausea, vomiting. Denies syncope or presyncope. Denies dizziness or lightheadedness. Denies snoring.  We discussed diet modifications to benefit mildly elevated LDL. No CAD or cor cals on recent CCTA. Recommend mediterranean diet. He is already quite active and has optimized his exercise regimen for cardiovascular health.   Past Medical History:  Diagnosis Date  . Fatigue     Past Surgical History:  Procedure Laterality Date  . FOOT SURGERY  2015   Arthritis  . HERNIA REPAIR  2005  . 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     Allergies:   Patient has no known allergies.   Social History   Tobacco Use  . Smoking status: Never Smoker  . Smokeless tobacco: Never Used  Substance Use Topics  . Alcohol use: Yes    Alcohol/week: 1.0 standard drink    Types: 1 Standard drinks or equivalent per week    Comment: daily- 1 drink  . Drug use: No     Family History: The patient's family history is negative for Dementia.  ROS:   Please see the history of present illness.    All other systems reviewed and are negative.  EKGs/Labs/Other Studies  Reviewed:    The following studies were reviewed today:  EKG:  Sinus bradycardia rate 46 bpm  Recent Labs: 03/22/2019: BUN 8; Creatinine, Ser 0.76; Potassium 4.2; Sodium 140  Recent Lipid Panel    Component Value Date/Time   CHOL 170 03/22/2019 0839   TRIG 76 03/22/2019 0839   HDL 44 03/22/2019 0839   CHOLHDL 3.9 03/22/2019 0839   LDLCALC 112 (H) 03/22/2019 0839    Physical Exam:    VS:  BP 126/82   Pulse (!) 46   Ht 5\' 10"  (1.778 m)   Wt 181 lb 12.8 oz (82.5 kg)   BMI 26.09 kg/m     Wt Readings from Last 5 Encounters:  11/04/19 181 lb 12.8 oz (82.5 kg)  03/08/19 190 lb (86.2 kg)  02/08/16 194 lb (88 kg)  12/25/15 192 lb 6.4 oz (87.3 kg)  09/10/15 190 lb (86.2 kg)    Constitutional: No acute distress Cardiovascular: regular rhythm, normal rate, no murmurs. S1 and S2 normal. Radial pulses normal bilaterally. No jugular venous distention.  Respiratory: clear to auscultation bilaterally GI : normal bowel sounds, soft and nontender. No distention.   MSK: extremities warm, well perfused. No edema.  NEURO: grossly nonfocal exam, moves all extremities. PSYCH: alert and oriented x 3, normal mood and affect.   ASSESSMENT:    1. Chest pain, unspecified type   2. Mixed hyperlipidemia    PLAN:    Chest  pain, unspecified type - Plan: EKG 12-Lead - no recurrence, would recommend monitoring symptoms. No CAD.   Mixed hyperlipidemia - aggressive dietary modification. If unable to reduced LDL to <70, consider low dose statin such as atorvastatin 10 mg daily.   Total time of encounter: 20 minutes total time of encounter, including 15 minutes spent in face-to-face patient care on the date of this encounter. This time includes coordination of care and counseling regarding above mentioned problem list. Remainder of non-face-to-face time involved reviewing chart documents/testing relevant to the patient encounter and documentation in the medical record. I have independently reviewed  documentation from referring provider.   Weston Brass, MD   [facility]    Medication Adjustments/Labs and Tests Ordered: Current medicines are reviewed at length with the patient today.  Concerns regarding medicines are outlined above.   Orders Placed This Encounter  Procedures  . EKG 12-Lead    No orders of the defined types were placed in this encounter.   Patient Instructions  Medication Instructions:  No Changes In Medications at this time.  *If you need a refill on your cardiac medications before your next appointment, please call your pharmacy*  Lab Work: None Ordered At This Time.  If you have labs (blood work) drawn today and your tests are completely normal, you will receive your results only by: Marland Kitchen MyChart Message (if you have MyChart) OR . A paper copy in the mail If you have any lab test that is abnormal or we need to change your treatment, we will call you to review the results.  Testing/Procedures: None Ordered At This Time.   Follow-Up: At [facility], you and your health needs are our priority.  As part of our continuing mission to provide you with exceptional heart care, we have created designated Provider Care Teams.  These Care Teams include your primary Cardiologist (physician) and Advanced Practice Providers (APPs -  Physician Assistants and Nurse Practitioners) who all work together to provide you with the care you need, when you need it.  Your next appointment:   AS NEEDED   The format for your next appointment:   In Person  Provider:   Weston Brass, MD

## 2020-02-14 ENCOUNTER — Other Ambulatory Visit: Payer: Self-pay

## 2020-02-14 ENCOUNTER — Ambulatory Visit (HOSPITAL_COMMUNITY)
Admission: EM | Admit: 2020-02-14 | Discharge: 2020-02-14 | Disposition: A | Discharge status: Home / Self Care | Payer: BC Managed Care – PPO | Attending: Family Medicine | Admitting: Family Medicine

## 2020-02-14 ENCOUNTER — Ambulatory Visit (INDEPENDENT_AMBULATORY_CARE_PROVIDER_SITE_OTHER): Payer: BC Managed Care – PPO

## 2020-02-14 ENCOUNTER — Encounter (HOSPITAL_COMMUNITY): Payer: Self-pay

## 2020-02-14 DIAGNOSIS — R059 Cough, unspecified: Secondary | ICD-10-CM

## 2020-02-14 DIAGNOSIS — U071 COVID-19: Secondary | ICD-10-CM | POA: Diagnosis not present

## 2020-02-14 MED ORDER — BENZONATATE 200 MG PO CAPS: 200.0000 mg | ORAL_CAPSULE | Freq: Two times a day (BID) | ORAL | 0 refills | Status: DC | PRN

## 2020-02-14 MED ORDER — AZITHROMYCIN 250 MG PO TABS: ORAL_TABLET | ORAL | 0 refills | Status: DC

## 2020-02-14 NOTE — ED Provider Notes (Signed)
[facility]    CSN: [id number] Arrival date & time: 02/14/20  1807      History   Chief Complaint Chief Complaint  Patient presents with  . Post Covid 02/01/2020 : Cough    HPI Steven Daugherty is a 50 y.o. male.   HPI   Patient is here for evaluation of cough.  Patient had Covid diagnosed on 01/30/2020.  He had the sudden onset of fever chills and body aches.  His Covid test was positive.  He states that the body aches and fatigue got better after a couple of days, however, the last 4 days his cough is gotten much worse.  Congestion.  Coughing up phlegm and mucus.  He still has fatigue off and on.  He still has some shortness of breath off and on.  He wonders why that 15 days of illness he is still having coughing and congestion.  He is concerned about the change in symptoms with a productive cough developing the last 3 to 4 days. No underlying lung disease Non-smoker  Past Medical History:  Diagnosis Date  . Fatigue     Patient Active Problem List   Diagnosis Date Noted  . Fatigue 12/26/2015  . Cognitive complaints 12/26/2015    Past Surgical History:  Procedure Laterality Date  . FOOT SURGERY  2015   Arthritis  . HERNIA REPAIR  2005  . 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 Authorizing Provider  azithromycin (ZITHROMAX Z-PAK) 250 MG tablet Take two pills today followed by one a day until gone 02/14/20  Yes Eustace Moore, MD  benzonatate (TESSALON) 200 MG capsule Take 1 capsule (200 mg total) by mouth 2 (two) times daily as needed for cough. 02/14/20  Yes Eustace Moore, MD    Family History Family History  Problem Relation Age of Onset  . Dementia Neg Hx     Social History Social History   Tobacco Use  . Smoking status: Never Smoker  . Smokeless tobacco: Never Used  Substance Use Topics  . Alcohol use: Yes     Alcohol/week: 1.0 standard drink    Types: 1 Standard drinks or equivalent per week    Comment: daily- 1 drink  . Drug use: No     Allergies   Patient has no known allergies.   Review of Systems Review of Systems See HPI  Physical Exam Triage Vital Signs ED Triage Vitals  Enc Vitals Group     BP 02/14/20 1852 (!) 143/95     Pulse Rate 02/14/20 1852 (!) 106     Resp 02/14/20 1852 20     Temp 02/14/20 1852 98.8 F (37.1 C)     Temp Source 02/14/20 1852 Oral     SpO2 02/14/20 1852 97 %     Weight --      Height --      Head Circumference --      Peak Flow --      Pain Score 02/14/20 1850 1     Pain Loc --      Pain Edu? --      Excl. in GC? --    No data found.  Updated Vital Signs BP (!) 143/95 (BP Location: Right Arm)   Pulse (!) 106   Temp 98.8 F (37.1 C) (Oral)   Resp  20   SpO2 97%      Physical Exam Constitutional:      General: He is not in acute distress.    Appearance: Normal appearance. He is well-developed, normal weight and well-nourished.  HENT:     Head: Normocephalic and atraumatic.     Nose: No congestion.     Mouth/Throat:     Mouth: Oropharynx is clear and moist. Mucous membranes are moist.     Pharynx: No posterior oropharyngeal erythema.     Comments: Mask is in place Eyes:     Conjunctiva/sclera: Conjunctivae normal.     Pupils: Pupils are equal, round, and reactive to light.  Cardiovascular:     Rate and Rhythm: Normal rate and regular rhythm.     Heart sounds: Normal heart sounds.  Pulmonary:     Effort: Pulmonary effort is normal. No respiratory distress.     Breath sounds: Rhonchi present.     Comments: Few anterior rhonchi Abdominal:     General: There is no distension.     Palpations: Abdomen is soft.  Musculoskeletal:        General: No edema. Normal range of motion.     Cervical back: Normal range of motion.  Skin:    General: Skin is warm and dry.  Neurological:     Mental Status: He is alert.  Psychiatric:         Behavior: Behavior normal.      UC Treatments / Results  Labs (all labs ordered are listed, but only abnormal results are displayed) Labs Reviewed - No data to display  EKG   Radiology DG Chest 2 View  Result Date: 02/14/2020 CLINICAL DATA:  Cough.  COVID positive 02/01/2020 EXAM: CHEST - 2 VIEW COMPARISON:  None. FINDINGS: The cardiomediastinal contours are normal. Minimal vague opacity at both lung bases. Pulmonary vasculature is normal. No pleural effusion or pneumothorax. No acute osseous abnormalities are seen. IMPRESSION: Minimal vague opacity at both lung bases may reflect atelectasis or pneumonia in the setting of COVID 19. Electronically Signed   By: Narda Rutherford M.D.   On: 02/14/2020 19:54    Procedures Procedures (including critical care time)  Medications Ordered in UC Medications - No data to display  Initial Impression / Assessment and Plan / UC Course  I have reviewed the triage vital signs and the nursing notes.  Pertinent labs & imaging results that were available during my care of the patient were reviewed by me and considered in my medical decision making (see chart for details).     Because patient was improving with the Covid typical infection and then developed a harsh coughing 4 days ago with sputum production, I am concerned that he may be developing a bacterial infection.  I will cover with antibiotics, Tessalon, fluids, return if not improving in a few days Final Clinical Impressions(s) / UC Diagnoses   Final diagnoses:  COVID-19  Cough     Discharge Instructions     Take antibiotic as directed Take Tessalon 2-3 times a day as needed for cough Continue to push fluids Expect improvement over the next few days    ED Prescriptions    Medication Sig Dispense Auth. Provider   benzonatate (TESSALON) 200 MG capsule Take 1 capsule (200 mg total) by mouth 2 (two) times daily as needed for cough. 20 capsule Eustace Moore, MD    azithromycin (ZITHROMAX Z-PAK) 250 MG tablet Take two pills today followed by one a day until gone 6  tablet Eustace Moore, MD     PDMP not reviewed this encounter.   Eustace Moore, MD 02/14/20 2005

## 2020-02-14 NOTE — ED Triage Notes (Signed)
Pt presents with ongoing cough and congestion  X 4 days; pt tested positive for covid on 02/01/2020

## 2020-02-14 NOTE — Discharge Instructions (Addendum)
Take antibiotic as directed Take Tessalon 2-3 times a day as needed for cough Continue to push fluids Expect improvement over the next few days

## 2020-02-15 ENCOUNTER — Ambulatory Visit (HOSPITAL_COMMUNITY): Payer: Self-pay

## 2020-08-03 ENCOUNTER — Ambulatory Visit (HOSPITAL_COMMUNITY)
Admission: EM | Admit: 2020-08-03 | Discharge: 2020-08-03 | Disposition: A | Discharge status: Home / Self Care | Payer: BC Managed Care – PPO | Attending: Family Medicine | Admitting: Family Medicine

## 2020-08-03 ENCOUNTER — Encounter (HOSPITAL_COMMUNITY): Payer: Self-pay

## 2020-08-03 ENCOUNTER — Other Ambulatory Visit: Payer: Self-pay

## 2020-08-03 ENCOUNTER — Telehealth (HOSPITAL_COMMUNITY): Payer: Self-pay

## 2020-08-03 DIAGNOSIS — T8141XA Infection following a procedure, superficial incisional surgical site, initial encounter: Secondary | ICD-10-CM

## 2020-08-03 DIAGNOSIS — L089 Local infection of the skin and subcutaneous tissue, unspecified: Secondary | ICD-10-CM

## 2020-08-03 MED ORDER — CEPHALEXIN 500 MG PO CAPS: 500.0000 mg | ORAL_CAPSULE | Freq: Two times a day (BID) | ORAL | 0 refills | Status: DC

## 2020-08-03 MED ORDER — HIBICLENS 4 % EX LIQD: Freq: Every day | CUTANEOUS | 0 refills | Status: DC | PRN

## 2020-08-03 NOTE — ED Provider Notes (Signed)
[facility]    CSN: [id number] Arrival date & time: 08/03/20  1834     History   Chief Complaint Chief Complaint  Patient presents with   Post-op Problem    HPI Steven Daugherty is a 50 y.o. male.   Patient presenting today with incisional pain, bleeding, discoloration to left groin region following a recent hernia repair procedure 03/2020. States his recovery had been unremarkable up to this point, but then last night developed pain and a large blood blister over the incision which bursted and bled for quite some time. He then this morning started having some further bleeding and thick drainage off and on as well as ongoing site pain. Denies fever, chills, sweats, N/V, bowel changes. Cleaning with antiseptic wash and keeping covered with neosporin and patch bandages.    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 Authorizing Provider  azithromycin (ZITHROMAX Z-PAK) 250 MG tablet Take two pills today followed by one a day until gone 02/14/20   Eustace Moore, MD  benzonatate (TESSALON) 200 MG capsule Take 1 capsule (200 mg total) by mouth 2 (two) times daily as needed for cough. 02/14/20   Eustace Moore, MD  cephALEXin (KEFLEX) 500 MG capsule Take 1 capsule (500 mg total) by mouth 2 (two) times daily. 08/03/20   Particia Nearing, PA-C  chlorhexidine (HIBICLENS) 4 % external liquid Apply topically daily as needed. 08/03/20   Particia Nearing, PA-C    Family History Family History  Problem Relation Age of Onset   Dementia Neg Hx     Social History Social  History   Tobacco Use   Smoking status: Never   Smokeless tobacco: Never  Substance Use Topics   Alcohol use: Yes    Alcohol/week: 1.0 standard drink    Types: 1 Standard drinks or equivalent per week    Comment: daily- 1 drink   Drug use: No     Allergies   Patient has no known allergies.   Review of Systems Review of Systems PER HPI    Physical Exam Triage Vital Signs ED Triage Vitals  Enc Vitals Group     BP 08/03/20 1906 (!) 143/91     Pulse Rate 08/03/20 1906 (!) 53     Resp 08/03/20 1906 18     Temp 08/03/20 1906 98.8 F (37.1 C)     Temp Source 08/03/20 1906 Oral     SpO2 08/03/20 1906 99 %     Weight --      Height --      Head Circumference --      Peak Flow --      Pain Score 08/03/20 1904 5     Pain Loc --      Pain Edu? --      Excl. in GC? --    No data found.  Updated Vital Signs BP (!) 143/91 (BP Location: Right Arm)   Pulse (!) 53 Comment: states his HR is usually low.  Temp 98.8 F (37.1 C) (Oral)   Resp 18   SpO2 99%   Visual Acuity Right Eye Distance:   Left Eye Distance:   Bilateral Distance:    Right Eye Near:   Left Eye Near:    Bilateral Near:     Physical Exam Vitals and nursing note reviewed.  Constitutional:      Appearance: Normal appearance.  HENT:     Head: Atraumatic.     Mouth/Throat:     Mouth: Mucous membranes are moist.     Pharynx: Oropharynx is clear.  Eyes:     Extraocular Movements: Extraocular movements intact.     Conjunctiva/sclera: Conjunctivae normal.  Cardiovascular:     Rate and Rhythm: Normal rate and regular rhythm.  Pulmonary:     Effort: Pulmonary effort is normal.     Breath sounds: Normal breath sounds.  Abdominal:     General: Bowel sounds are normal. There is no distension.     Palpations: Abdomen is soft.     Tenderness: There is no abdominal tenderness. There is no right CVA tenderness, left CVA tenderness or guarding.  Musculoskeletal:        General: Normal range of motion.      Cervical back: Normal range of motion and neck supple.  Skin:    General: Skin is warm.     Comments: Erythema, small pustules forming at inguinal hernia repair incision left groin. Mildly ttp, no edema, distention, active drainage from site  Neurological:     General: No focal deficit present.     Mental Status: He is oriented to person, place, and time.  Psychiatric:        Mood and Affect: Mood normal.        Thought Content: Thought content normal.        Judgment: Judgment normal.   UC Treatments / Results  Labs (all labs ordered are listed, but only abnormal results are displayed) Labs Reviewed - No data to display  EKG   Radiology No results found.  Procedures Procedures (including critical care time)  Medications Ordered in UC Medications - No data to display  Initial Impression / Assessment and Plan / UC Course  I have reviewed the triage vital signs and the nursing notes.  Pertinent labs & imaging results that were available during my care of the patient were reviewed by me and considered in my medical decision making (see chart for details).     Localized irritation, possibly early infection. Will cover with keflex, hibiclens and good wound care and he plans to call surgeon in morning to re-check area. ED precautions reviewed for significantly worsening sxs.   Final Clinical Impressions(s) / UC Diagnoses   Final diagnoses:  Infection of superficial incisional surgical site after procedure, initial encounter   Discharge Instructions   None    ED Prescriptions     Medication Sig Dispense Auth. Provider   cephALEXin (KEFLEX) 500 MG capsule Take 1 capsule (500 mg total) by mouth 2 (two) times daily. 14 capsule Particia Nearing, [place]   chlorhexidine (HIBICLENS) 4 % external liquid Apply topically daily as needed. 120 mL Particia Nearing, [place]      PDMP not reviewed this encounter.   Particia Nearing, [place] 08/03/20 2032

## 2020-08-03 NOTE — ED Triage Notes (Signed)
Pt presents with a possible infection after a surgical procedure.   States the area started bleeding this morning.

## 2020-10-12 ENCOUNTER — Telehealth: Payer: BC Managed Care – PPO | Admitting: Physician Assistant

## 2020-10-12 DIAGNOSIS — R410 Disorientation, unspecified: Secondary | ICD-10-CM

## 2020-10-12 NOTE — Patient Instructions (Signed)
  Cresenciano Genre, thank you for joining Piedad Climes, PA-C for today's virtual visit.  While this provider is not your primary care provider (PCP), if your PCP is located in our provider database this encounter information will be shared with them immediately following your visit.  Consent: (Patient) Steven Daugherty provided verbal consent for this virtual visit at the beginning of the encounter.  Current Medications:  Current Outpatient Medications:    chlorhexidine (HIBICLENS) 4 % external liquid, Apply topically daily as needed., Disp: 120 mL, Rfl: 0   Medications ordered in this encounter:  No orders of the defined types were placed in this encounter.    *If you need refills on other medications prior to your next appointment, please contact your pharmacy*  Follow-Up: Call back or seek an in-person evaluation if the symptoms worsen or if the condition fails to improve as anticipated.  Other Instructions You need evaluation today ASAP even though symptoms have resolved. I really recommend the ER for full assessment but if nothing else you need to at least be seen at nearest Urgent Care for evaluation.  If there is any recurrence of symptoms before evaluation, call 911.   If you have been instructed to have an in-person evaluation today at a local Urgent Care facility, please use the link below. It will take you to a list of all of our available [facility], including address, phone number and hours of operation. Please do not delay care.  [facility]  If you or a family member do not have a primary care provider, use the link below to schedule a visit and establish care. When you choose a [facility] primary care physician or advanced practice provider, you gain a long-term partner in health. Find a Primary Care Provider  Learn more about [facility] in-office and virtual care options: [place] - Get Care Now

## 2020-10-12 NOTE — Progress Notes (Signed)
Virtual Visit Consent   Steven Daugherty, you are scheduled for a virtual visit with a [facility] provider today.     Just as with appointments in the office, your consent must be obtained to participate.  Your consent will be active for this visit and any virtual visit you may have with one of our providers in the next 365 days.     If you have a MyChart account, a copy of this consent can be sent to you electronically.  All virtual visits are billed to your insurance company just like a traditional visit in the office.    As this is a virtual visit, video technology does not allow for your provider to perform a traditional examination.  This may limit your provider's ability to fully assess your condition.  If your provider identifies any concerns that need to be evaluated in person or the need to arrange testing (such as labs, EKG, etc.), we will make arrangements to do so.     Although advances in technology are sophisticated, we cannot ensure that it will always work on either your end or our end.  If the connection with a video visit is poor, the visit may have to be switched to a telephone visit.  With either a video or telephone visit, we are not always able to ensure that we have a secure connection.     I need to obtain your verbal consent now.   Are you willing to proceed with your visit today?    Steven Daugherty has provided verbal consent on 10/12/2020 for a virtual visit (video or telephone).   Piedad Climes, [place]   Date: 10/12/2020 11:06 AM   Virtual Visit via Video Note   I, Piedad Climes, connected with  Steven Daugherty  ([id number], 02-Dec-1970) on 10/12/20 at 11:30 AM EDT by a video-enabled telemedicine application and verified that I am speaking with the correct person using two identifiers.  Location: Patient: Virtual Visit Location Patient: Home Provider: Virtual Visit Location Provider: Home Office   I discussed the limitations of evaluation and management by  telemedicine and the availability of in person appointments. The patient expressed understanding and agreed to proceed.    History of Present Illness: Steven Daugherty is a 50 y.o. who identifies as a male who was assigned male at birth, and is being seen today for concern of stroke-like symptoms this morning. States he feels completely fine now with no symptoms. Notes earlier this morning while working at home he noticed some abrupt confusion and feeling disoriented.This was associated with some mild blurring of vision that quickly resolved. Also with some left arm tingling. No chest pain, lightheadedness or dizziness. Denies palpitations or SOB. Symptoms lasting about 30 minutes before completely resolving. Has not had anything like this before. Denies any facial weakness, difficulty with speech, extremity weakness. States he called PCP office but no availability. Did not speak with triage.   HPI: HPI  Problems:  Patient Active Problem List   Diagnosis Date Noted   Fatigue 12/26/2015   Cognitive complaints 12/26/2015    Allergies: No Known Allergies Medications:  Current Outpatient Medications:    chlorhexidine (HIBICLENS) 4 % external liquid, Apply topically daily as needed., Disp: 120 mL, Rfl: 0  Observations/Objective: Patient is well-developed, well-nourished in no acute distress.  Resting comfortably at home.  Head is normocephalic, atraumatic.  No labored breathing. Speech is clear and coherent with logical content.  Patient is alert and oriented at baseline.  Normal  facial movements with symmetry. Normal ROM of left shoulder and arm without weakness.   Assessment and Plan: 1. Confusion Needs further assessment and TIA/stroke rule out. Completely asymptomatic at present but concern if TIA that risk for neurological event in the near future. Recommend ER assessment today. At least UC for initial evaluation if refusing ER. 911 for any recurrence of symptoms. He is gong to speak with  wife and be taken to nearest facility.   Follow Up Instructions: I discussed the assessment and treatment plan with the patient. The patient was provided an opportunity to ask questions and all were answered. The patient agreed with the plan and demonstrated an understanding of the instructions.  A copy of instructions were sent to the patient via MyChart unless otherwise noted below.    The patient was advised to call back or seek an in-person evaluation if the symptoms worsen or if the condition fails to improve as anticipated.  Time:  I spent 12 minutes with the patient via telehealth technology discussing the above problems/concerns.    Piedad Climes, PA-C

## 2021-04-27 DIAGNOSIS — G4730 Sleep apnea, unspecified: Secondary | ICD-10-CM | POA: Insufficient documentation

## 2021-11-17 IMAGING — DX DG CHEST 2V
2 series · 2 of 2 positions shown · non-contrast
Comparison: None.

CLINICAL DATA: Cough.  COVID positive 02/01/2020

EXAM:
CHEST - 2 VIEW

[chest pa]
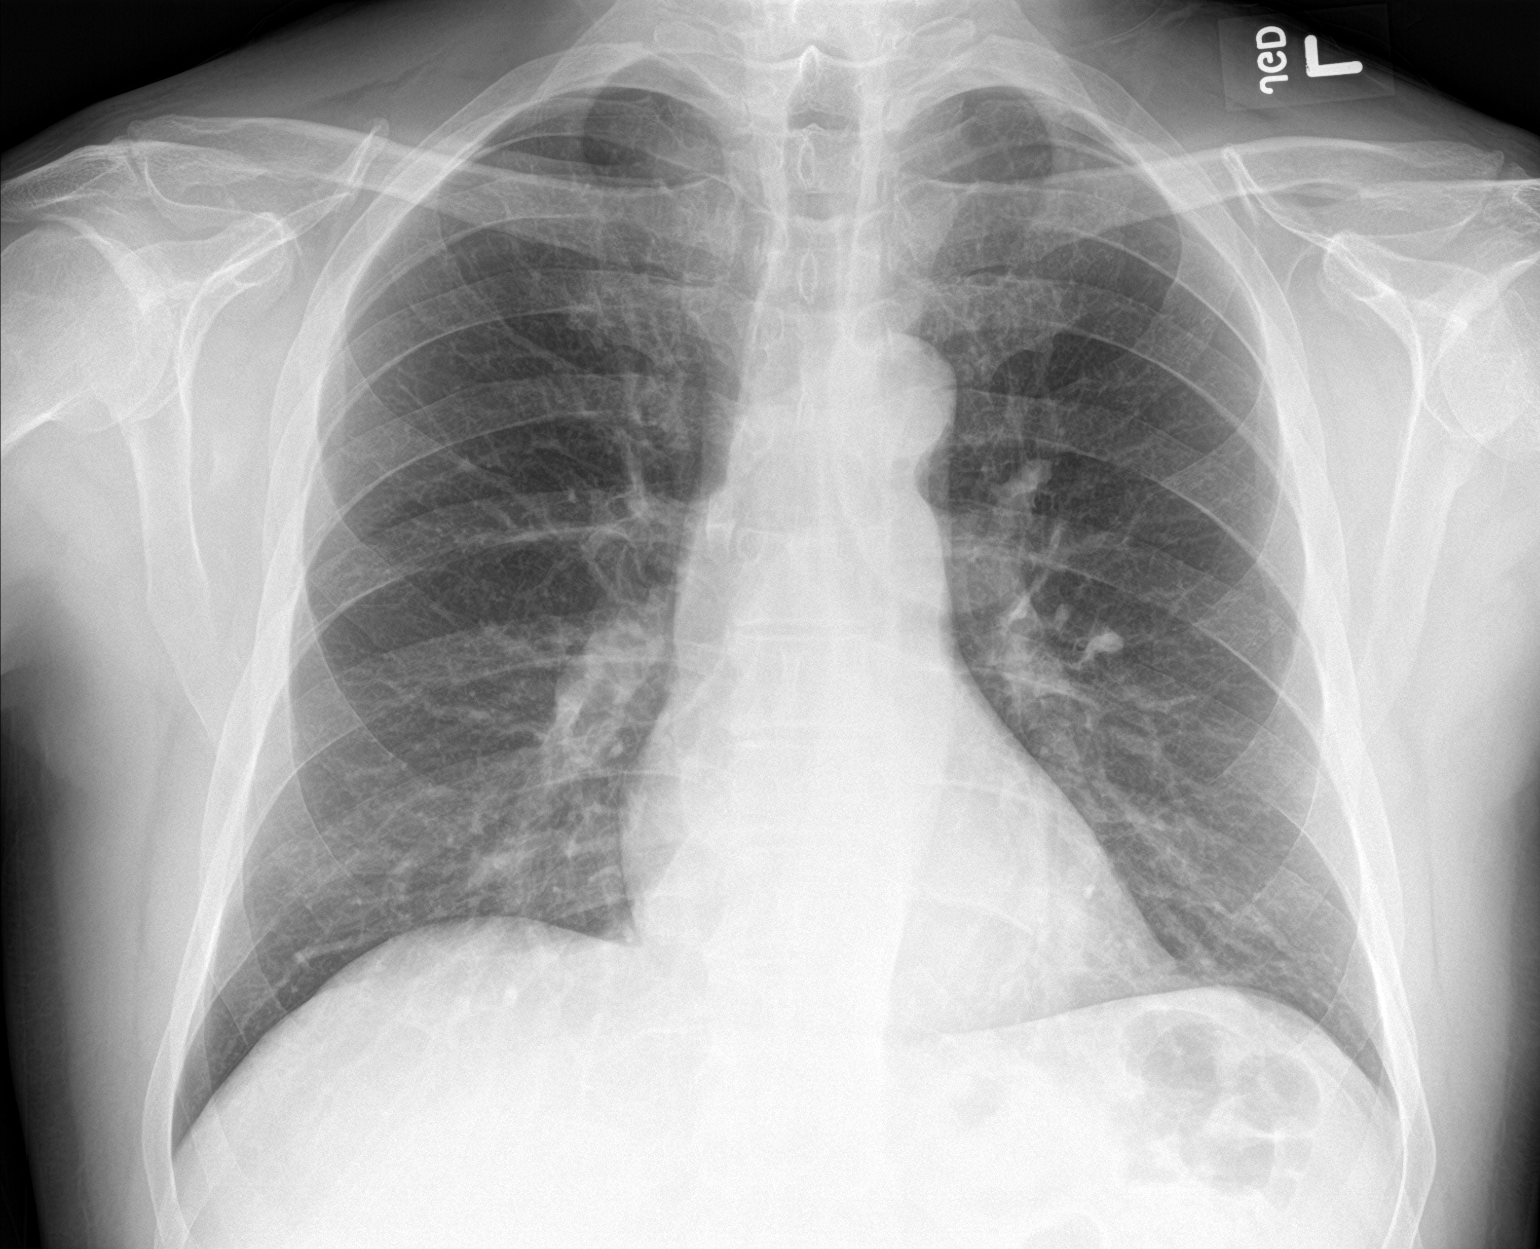

[chest lat]
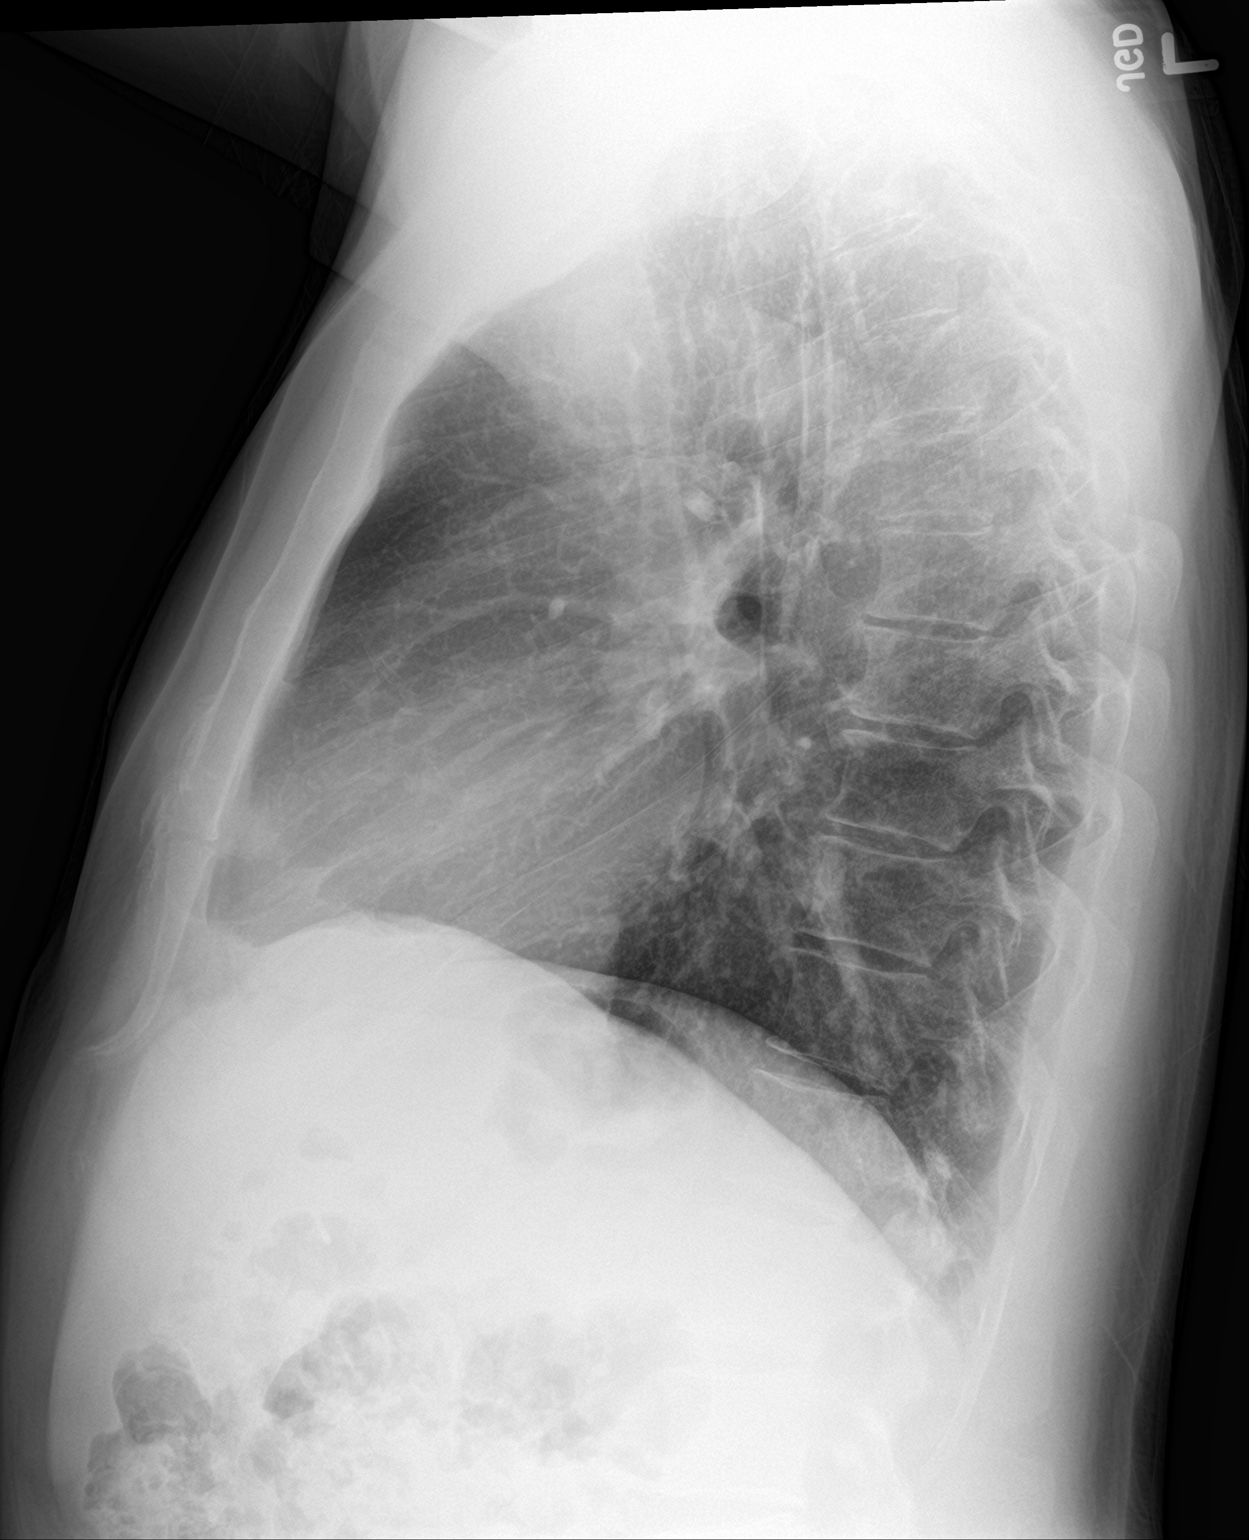

[2 of 2 positions shown; findings below may reference images not displayed]

FINDINGS: The cardiomediastinal contours are normal. Minimal vague opacity at
both lung bases. Pulmonary vasculature is normal. No pleural
effusion or pneumothorax. No acute osseous abnormalities are seen.
IMPRESSION: Minimal vague opacity at both lung bases may reflect atelectasis or
pneumonia in the setting of COVID 19.

## 2022-05-12 ENCOUNTER — Emergency Department (HOSPITAL_COMMUNITY): Payer: No Typology Code available for payment source

## 2022-05-12 ENCOUNTER — Emergency Department (HOSPITAL_COMMUNITY)
Admission: EM | Admit: 2022-05-12 | Discharge: 2022-05-12 | Disposition: A | Discharge status: Home / Self Care | Payer: No Typology Code available for payment source | Attending: Emergency Medicine | Admitting: Emergency Medicine

## 2022-05-12 ENCOUNTER — Other Ambulatory Visit: Payer: Self-pay

## 2022-05-12 ENCOUNTER — Encounter (HOSPITAL_COMMUNITY): Payer: Self-pay | Admitting: *Deleted

## 2022-05-12 DIAGNOSIS — Z23 Encounter for immunization: Secondary | ICD-10-CM | POA: Insufficient documentation

## 2022-05-12 DIAGNOSIS — S59912A Unspecified injury of left forearm, initial encounter: Secondary | ICD-10-CM | POA: Diagnosis present

## 2022-05-12 DIAGNOSIS — S51812A Laceration without foreign body of left forearm, initial encounter: Secondary | ICD-10-CM | POA: Insufficient documentation

## 2022-05-12 DIAGNOSIS — W312XXA Contact with powered woodworking and forming machines, initial encounter: Secondary | ICD-10-CM | POA: Diagnosis not present

## 2022-05-12 MED ORDER — LIDOCAINE-EPINEPHRINE (PF) 2 %-1:200000 IJ SOLN
INTRAMUSCULAR | Status: AC
  Administered 2022-05-12: 20 mL
  Filled 2022-05-12: qty 20

## 2022-05-12 MED ORDER — BACITRACIN ZINC 500 UNIT/GM EX OINT: 1.0000 | TOPICAL_OINTMENT | Freq: Two times a day (BID) | CUTANEOUS | 0 refills | Status: AC

## 2022-05-12 MED ORDER — LIDOCAINE-EPINEPHRINE (PF) 2 %-1:200000 IJ SOLN: 20.0000 mL | Freq: Once | INTRAMUSCULAR | Status: AC

## 2022-05-12 MED ORDER — CEPHALEXIN 500 MG PO CAPS: 500.0000 mg | ORAL_CAPSULE | Freq: Three times a day (TID) | ORAL | 0 refills | Status: DC

## 2022-05-12 MED ORDER — BACITRACIN ZINC 500 UNIT/GM EX OINT
TOPICAL_OINTMENT | CUTANEOUS | Status: AC
  Administered 2022-05-12: 1 via TOPICAL
  Filled 2022-05-12: qty 0.9

## 2022-05-12 MED ORDER — TETANUS-DIPHTH-ACELL PERTUSSIS 5-2.5-18.5 LF-MCG/0.5 IM SUSY
0.5000 mL | PREFILLED_SYRINGE | Freq: Once | INTRAMUSCULAR | Status: AC
  Administered 2022-05-12: 0.5 mL via INTRAMUSCULAR
  Filled 2022-05-12: qty 0.5

## 2022-05-12 MED ORDER — OXYCODONE-ACETAMINOPHEN 5-325 MG PO TABS
1.0000 | ORAL_TABLET | Freq: Once | ORAL | Status: AC
  Administered 2022-05-12: 1 via ORAL
  Filled 2022-05-12: qty 1

## 2022-05-12 NOTE — Discharge Instructions (Addendum)
You were seen in the ER today for evaluation of your arm laceration.  You did cut one of your sensory nerves.  I would like for you to follow-up with a hand surgeon.  I have included information for Dr. Yehuda Budd into this discharge paperwork.  Please make sure you call them tomorrow to schedule an appointment.  I am discharging you home on some antibiotics.  Take 3 times daily for the next 5 days.  Please make sure you are cleaning your wound at least once daily with Dial soap and water other Shur-Clens given to you.  Please leave it covered with a bulky dressing at least for the next 24 to 48 hours.  Do not soak your arm or hand in any dirty dishwater, pools, oceans, lakes, ponds, bath water, etc.  Please make sure you are to keep it as clean as possible.  Please return to your primary care, urgent care, or the ER in the next 7 to 10 days for suture removal.  You take 600 mg of ibuprofen and or 1000 mg of Tylenol every 6 hours as needed for pain.  If you have any concerns, new or worsening symptoms, please return to the nearest emergency department for reevaluation.  Contact a doctor if: You got a tetanus shot and you have any of these problems where the needle went in: Swelling. Very bad pain. Redness. Bleeding. A wound that was closed breaks open. You have a fever. You have any of these signs of infection in your wound: More redness, swelling, or pain. Fluid or blood. Warmth. Pus or a bad smell. You see something coming out of the wound, such as wood or glass. Medicine does not make your pain go away. You notice a change in the color of your skin near your wound. You need to change the bandage often. You have a new rash. You lose feeling (have numbness) around the wound. Get help right away if: You have very bad swelling around the wound. Your pain suddenly gets worse and is very bad. You have painful lumps near the wound or on skin anywhere on your body. You have a red streak going away  from your wound. The wound is on your hand or foot, and: You cannot move a finger or toe. Your fingers or toes look pale or bluish.

## 2022-05-12 NOTE — ED Notes (Signed)
Applied dressing to wound using bacitracin, non-adherent dressing, gauze sponges, rolled gauze, and coban. Educated pt on technique for dressing change and verified understanding of wound care instructions and follow-up care.

## 2022-05-12 NOTE — ED Provider Notes (Signed)
[place] EMERGENCY DEPARTMENT AT [facility] Provider Note   CSN: [id number] Arrival date & time: 05/12/22  1215     History Chief Complaint  Patient presents with   Wrist Injury    Steven Daugherty is a 52 y.o. male presents to the ER today for evaluation of left forearm laceration.  Patient reports he was using a miter saw all and cutting wood when it slipped and hit his arm.  He denies any numbness or tingling.  He thinks his tetanus was updated within the past 5 to 6 years but is not completely sure.  Denies any coagulopathy or bleeding disorders.  He is right-hand dominant.  No known drug allergies.   Wrist Injury      Home Medications Prior to Admission medications   Medication Sig Start Date End Date Taking? Authorizing Provider  chlorhexidine (HIBICLENS) 4 % external liquid Apply topically daily as needed. 08/03/20   Particia Nearing, PA-C      Allergies    Patient has no known allergies.    Review of Systems   Review of Systems  Skin:  Positive for wound.    Physical Exam Updated Vital Signs BP (!) 138/101   Pulse (!) 58   Resp 18   Ht 5\' 10"  (1.778 m)   Wt 82.5 kg   SpO2 98%   BMI 26.10 kg/m  Physical Exam Vitals and nursing note reviewed.  Constitutional:      General: He is not in acute distress.    Appearance: Normal appearance. He is not toxic-appearing.  Eyes:     General: No scleral icterus. Pulmonary:     Effort: Pulmonary effort is normal. No respiratory distress.  Musculoskeletal:        General: Signs of injury present.     Comments: Approximately 3cm laceration to his left forearm. Fascia appears intact. Does not appear obviously contaminated. Compartments are soft. Cap refill brisk on all five fingers. Palpable radial pulse. Suspected nerve injury. Possible exposed nerve seen to the more palmar aspect of the injury.  Finger thumb opposition with all 4 fingers with and without resistance intact.  He does have some sensation  loss to the more dorsal aspect of the thumb and the dorsal aspect of the hand and did not reference to the thenar eminence.  Is able to AB and abduct his thumb.  Sensation intact to the distal tip and at the palmar aspect.  Skin:    General: Skin is dry.     Findings: No rash.  Neurological:     General: No focal deficit present.     Mental Status: He is alert. Mental status is at baseline.     ED Results / Procedures / Treatments   Labs (all labs ordered are listed, but only abnormal results are displayed) Labs Reviewed - No data to display  EKG None  Radiology DG Forearm Left  Result Date: 05/12/2022 CLINICAL DATA:  Laceration.  Chain saw accident. EXAM: LEFT FOREARM - 2 VIEW COMPARISON:  None Available. FINDINGS: No evidence for an acute fracture. No cortical disruption in the distal forearm. Skin laceration noted distally. No evidence for retained radiopaque soft tissue foreign body. IMPRESSION: No acute bony abnormality. No evidence for radiopaque soft tissue foreign body. Electronically Signed   By: Kennith Center M.D.   On: 05/12/2022 13:25    Procedures .Marland KitchenLaceration Repair  Date/Time: 05/12/2022 3:57 PM  Performed by: Achille Rich, PA-C Authorized by: Achille Rich, PA-C   Consent:  Consent obtained:  Verbal   Consent given by:  Patient   Risks, benefits, and alternatives were discussed: yes     Risks discussed:  Infection, pain, nerve damage, need for additional repair, retained foreign body, poor cosmetic result, tendon damage, poor wound healing and vascular damage   Alternatives discussed:  No treatment Universal protocol:    Procedure explained and questions answered to patient or proxy's satisfaction: yes     Imaging studies available: yes     Patient identity confirmed:  Verbally with patient Anesthesia:    Anesthesia method:  Local infiltration   Local anesthetic:  Lidocaine 2% WITH epi Laceration details:    Location:  Shoulder/arm   Shoulder/arm  location:  L lower arm   Length (cm):  5 Pre-procedure details:    Preparation:  Imaging obtained to evaluate for foreign bodies and patient was prepped and draped in usual sterile fashion Exploration:    Imaging obtained: x-ray     Imaging outcome: foreign body not noted     Wound exploration: wound explored through full range of motion and entire depth of wound visualized     Wound extent: nerve damage     Wound extent: fascia not violated, no foreign body and no underlying fracture     Contaminated: no   Treatment:    Area cleansed with:  Shur-Clens   Amount of cleaning:  Extensive   Irrigation solution:  Sterile saline   Irrigation volume:    Irrigation method:  Pressure wash, syringe and tap Skin repair:    Repair method:  Sutures   Suture size:  4-0   Suture material:  Prolene   Suture technique:  Simple interrupted   Number of sutures:  10 Approximation:    Approximation:  Close Repair type:    Repair type:  Simple Post-procedure details:    Dressing:  Antibiotic ointment and bulky dressing   Procedure completion:  Tolerated well, no immediate complications    Medications Ordered in ED Medications  lidocaine-EPINEPHrine (XYLOCAINE W/EPI) 2 %-1:200000 (PF) injection 20 mL (has no administration in time range)  Tdap (BOOSTRIX) injection 0.5 mL (has no administration in time range)    ED Course/ Medical Decision Making/ A&P    Medical Decision Making Amount and/or Complexity of Data Reviewed Radiology: ordered.  Risk OTC drugs. Prescription drug management.   52 y.o. male presents to the ER today for evaluation of arm laceration. Differential diagnosis includes but is not limited to tendon damage nerve damage, vascular damage, fracture, laceration. Vital signs show mildly elevated blood pressure otherwise unremarkable. Physical exam as noted above.   X-ray imaging shows No acute bony abnormality. No evidence for radiopaque soft tissue foreign body.  I  thoroughly cleansed and irrigated the wound with over 2 L of normal saline and Shur-Clens.  Potential exposed and severed nerve seen.  Possible sensory over motor given that patient has motor function intact of the hand.  He does have some sensory deficits in the more dorsal aspect of the thumb.  My attending assessed at bedside.  Just recommended suturing.  10 Prolene sutures were placed.  Patient tolerated procedure well.  Bacitracin was ordered and bulky dressing was placed.  Mended the patient come back in 7 to 10 days for suture removal.  Discussed with him giving him some antibiotics given that he was working with fluid and his hands were dirty however the wound was not contaminated.  Keflex 3 times daily for the next 5 days ordered.  Discussed laceration care.  We discussed plan at bedside including laceration repair, taking antibiotics, and the importance of following up with hand surgery.  We discussed strict return precautions and red flag symptoms. The patient verbalized their understanding and agrees to the plan. The patient is stable and being discharged home in good condition.  I discussed this case with my attending physician who cosigned this note including patient's presenting symptoms, physical exam, and planned diagnostics and interventions. Attending physician stated agreement with plan or made changes to plan which were implemented.   Attending physician assessed patient at bedside.  Portions of this report may have been transcribed using voice recognition software. Every effort was made to ensure accuracy; however, inadvertent computerized transcription errors may be present.   Final Clinical Impression(s) / ED Diagnoses Final diagnoses:  Laceration of left forearm, initial encounter    Rx / DC Orders ED Discharge Orders          Ordered    cephALEXin (KEFLEX) 500 MG capsule  3 times daily        05/12/22 1603    bacitracin ointment  2 times daily        05/12/22 1706               Achille Rich, [place] 05/12/22 1714    Eber Hong, MD 05/14/22 [id number]

## 2022-05-12 NOTE — ED Triage Notes (Signed)
PT states cut L wrist with miter saw.  Bleeding controlled.  3 inch lac to wrist.

## 2022-05-26 ENCOUNTER — Inpatient Hospital Stay (HOSPITAL_COMMUNITY)
Admission: RE | Admit: 2022-05-26 | Discharge: 2022-05-26 | Disposition: A | Discharge status: Home / Self Care | Payer: No Typology Code available for payment source | Source: Ambulatory Visit

## 2022-05-26 ENCOUNTER — Emergency Department (HOSPITAL_COMMUNITY): Payer: No Typology Code available for payment source

## 2022-05-26 ENCOUNTER — Encounter (HOSPITAL_COMMUNITY): Payer: Self-pay

## 2022-05-26 ENCOUNTER — Other Ambulatory Visit: Payer: Self-pay

## 2022-05-26 ENCOUNTER — Observation Stay (HOSPITAL_COMMUNITY)
Admission: EM | Admit: 2022-05-26 | Discharge: 2022-05-27 | Disposition: A | Discharge status: Home / Self Care | Payer: No Typology Code available for payment source | Attending: Obstetrics and Gynecology | Admitting: Obstetrics and Gynecology

## 2022-05-26 DIAGNOSIS — R030 Elevated blood-pressure reading, without diagnosis of hypertension: Secondary | ICD-10-CM

## 2022-05-26 DIAGNOSIS — R072 Precordial pain: Secondary | ICD-10-CM | POA: Diagnosis not present

## 2022-05-26 DIAGNOSIS — R001 Bradycardia, unspecified: Secondary | ICD-10-CM | POA: Diagnosis not present

## 2022-05-26 DIAGNOSIS — R0602 Shortness of breath: Secondary | ICD-10-CM | POA: Diagnosis present

## 2022-05-26 DIAGNOSIS — Z79899 Other long term (current) drug therapy: Secondary | ICD-10-CM | POA: Insufficient documentation

## 2022-05-26 DIAGNOSIS — R079 Chest pain, unspecified: Secondary | ICD-10-CM | POA: Diagnosis not present

## 2022-05-26 LAB — CBC WITH DIFFERENTIAL/PLATELET
Abs Immature Granulocytes: 0.01 10*3/uL (ref 0.00–0.07)
Basophils Absolute: 0.1 10*3/uL (ref 0.0–0.1)
Basophils Relative: 1 %
Eosinophils Absolute: 0.1 10*3/uL (ref 0.0–0.5)
Eosinophils Relative: 2 %
HCT: 46.2 % (ref 39.0–52.0)
Hemoglobin: 15.6 g/dL (ref 13.0–17.0)
Immature Granulocytes: 0 %
Lymphocytes Relative: 41 %
Lymphs Abs: 2.1 10*3/uL (ref 0.7–4.0)
MCH: 31 pg (ref 26.0–34.0)
MCHC: 33.8 g/dL (ref 30.0–36.0)
MCV: 91.7 fL (ref 80.0–100.0)
Monocytes Absolute: 0.5 10*3/uL (ref 0.1–1.0)
Monocytes Relative: 11 %
Neutro Abs: 2.3 10*3/uL (ref 1.7–7.7)
Neutrophils Relative %: 45 %
Platelets: 167 10*3/uL (ref 150–400)
RBC: 5.04 MIL/uL (ref 4.22–5.81)
RDW: 12.1 % (ref 11.5–15.5)
WBC: 5 10*3/uL (ref 4.0–10.5)
nRBC: 0 % (ref 0.0–0.2)

## 2022-05-26 LAB — COMPREHENSIVE METABOLIC PANEL
ALT: 29 U/L (ref 0–44)
AST: 25 U/L (ref 15–41)
Albumin: 4 g/dL (ref 3.5–5.0)
Alkaline Phosphatase: 82 U/L (ref 38–126)
Anion gap: 8 (ref 5–15)
BUN: 10 mg/dL (ref 6–20)
CO2: 26 mmol/L (ref 22–32)
Calcium: 8.9 mg/dL (ref 8.9–10.3)
Chloride: 106 mmol/L (ref 98–111)
Creatinine, Ser: 0.7 mg/dL (ref 0.61–1.24)
GFR, Estimated: >60 mL/min (ref >60)
Glucose, Bld: 107 mg/dL — ABNORMAL HIGH (ref 70–99)
Potassium: 3.7 mmol/L (ref 3.5–5.1)
Sodium: 140 mmol/L (ref 135–145)
Total Bilirubin: 0.7 mg/dL (ref 0.3–1.2)
Total Protein: 6.4 g/dL — ABNORMAL LOW (ref 6.5–8.1)

## 2022-05-26 LAB — MAGNESIUM: Magnesium: 2.2 mg/dL (ref 1.7–2.4)

## 2022-05-26 LAB — D-DIMER, QUANTITATIVE: D-Dimer, Quant: 0.36 ug/mL-FEU (ref 0.00–0.50)

## 2022-05-26 LAB — TROPONIN I (HIGH SENSITIVITY)
Troponin I (High Sensitivity): 5 ng/L (ref <18)
Troponin I (High Sensitivity): 6 ng/L (ref <18)

## 2022-05-26 LAB — TSH: TSH: 2.041 u[IU]/mL (ref 0.350–4.500)

## 2022-05-26 MED ORDER — MAGNESIUM HYDROXIDE 400 MG/5ML PO SUSP: 30.0000 mL | Freq: Every day | ORAL | Status: DC | PRN

## 2022-05-26 MED ORDER — SODIUM CHLORIDE 0.9 % IV SOLN: INTRAVENOUS | Status: DC

## 2022-05-26 MED ORDER — ENOXAPARIN SODIUM 40 MG/0.4ML IJ SOSY
40.0000 mg | PREFILLED_SYRINGE | INTRAMUSCULAR | Status: DC
  Administered 2022-05-27: 40 mg via SUBCUTANEOUS
  Filled 2022-05-26: qty 0.4

## 2022-05-26 MED ORDER — ACETAMINOPHEN 325 MG PO TABS
650.0000 mg | ORAL_TABLET | Freq: Four times a day (QID) | ORAL | Status: DC | PRN
  Administered 2022-05-27: 650 mg via ORAL
  Filled 2022-05-26: qty 2

## 2022-05-26 MED ORDER — ACETAMINOPHEN 650 MG RE SUPP: 650.0000 mg | Freq: Four times a day (QID) | RECTAL | Status: DC | PRN

## 2022-05-26 MED ORDER — ONDANSETRON HCL 4 MG PO TABS: 4.0000 mg | ORAL_TABLET | Freq: Four times a day (QID) | ORAL | Status: DC | PRN

## 2022-05-26 MED ORDER — TRAZODONE HCL 50 MG PO TABS: 25.0000 mg | ORAL_TABLET | Freq: Every evening | ORAL | Status: DC | PRN

## 2022-05-26 MED ORDER — HYDRALAZINE HCL 20 MG/ML IJ SOLN: 10.0000 mg | Freq: Four times a day (QID) | INTRAMUSCULAR | Status: DC | PRN

## 2022-05-26 MED ORDER — ONDANSETRON HCL 4 MG/2ML IJ SOLN: 4.0000 mg | Freq: Four times a day (QID) | INTRAMUSCULAR | Status: DC | PRN

## 2022-05-26 NOTE — ED Notes (Signed)
Pt received for care at 1905.  Quietly resting in bed; respirations even and unlabored.  This RN introduced self to pt and visitor at bedside; both members updated on current plan of care.  Bed in lowest position, wheels locked.  Call bell within reach.

## 2022-05-26 NOTE — ED Notes (Signed)
Pt taking home dose of pain medication at this time for arm: 200 mg Ibuprofen and 500 mg Tylenol.

## 2022-05-26 NOTE — H&P (Incomplete)
[place]   PATIENT NAME: Steven Daugherty    MR#:  [id number]  DATE OF BIRTH:  [birth year]  DATE OF ADMISSION:  05/26/2022  PRIMARY CARE PHYSICIAN: Tally Joe, MD   Patient is coming from: Home  REQUESTING/REFERRING PHYSICIAN: Marlene Bast  CHIEF COMPLAINT:  Chest pain  HISTORY OF PRESENT ILLNESS:  Steven Daugherty is a 52 y.o. male with no significant medical history, who presented to the emergency room with acute onset of left parasternal chest pain felt as a sharp pain with dyspnea with associated palpitations that he felt in his ears.  No associated nausea or vomiting or diaphoresis.  No leg pain or edema or recent travels or surgeries.  No fever or chills.  No cough or wheezing or hemoptysis.  No dysuria, oliguria or hematuria or flank pain.  No bleeding diathesis.  The patient had peripheral nerve repair on the left wrist on Friday after having a left wrist injury and laceration.  ED Course: When the patient came to the ER, BP was 128/97 and heart rate is 44 with otherwise normal vital signs.  Labs revealed total protein of 6.4 with otherwise unremarkable CMP.  Magnesium was 2.2 and CBC was within normal.  TSH was 2.04. EKG as reviewed by me : EKG showed sinus bradycardia with a rate of 42 with early repolarization and J-point elevation. Imaging: 2 view chest x-ray showed no acute cardiopulmonary disease.  The patient will be admitted to an observation cardiac telemetry bed for further evaluation and management. 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 All pertinent systems were reviewed above. Constitutional, HEENT, cardiovascular, respiratory, GI, GU, musculoskeletal, neuro, psychiatric, endocrine, integumentary and hematologic systems were reviewed and are otherwise negative/unremarkable except for positive findings mentioned above in the HPI.   MEDICATIONS AT HOME:   Prior to Admission medications   Medication Sig Start Date End Date Taking? Authorizing Provider  bacitracin ointment Apply 1 Application topically 2 (two) times daily. 05/12/22   Achille Rich, PA-C  cephALEXin (KEFLEX) 500 MG capsule Take 1 capsule (500 mg total) by mouth 3 (three) times daily. 05/12/22   Achille Rich, PA-C  chlorhexidine (HIBICLENS) 4 % external liquid Apply topically daily as needed. 08/03/20   Particia Nearing, PA-C      VITAL SIGNS:  Blood pressure (!) 119/97, pulse (!) 44, temperature 98 F (36.7 C), temperature source Oral, resp. rate 17, weight 79.4 kg, SpO2 100 %.  PHYSICAL EXAMINATION:  Physical Exam  GENERAL:  52 y.o.-year-old Caucasian male patient lying in the bed with no acute distress.  EYES: Pupils equal, round,  reactive to light and accommodation. No scleral icterus. Extraocular muscles intact.  HEENT: Head atraumatic, normocephalic. Oropharynx and nasopharynx clear.  NECK:  Supple, no jugular venous distention. No thyroid enlargement, no tenderness.  LUNGS: Normal breath sounds bilaterally, no wheezing, rales,rhonchi or crepitation. No use of accessory muscles of respiration.  CARDIOVASCULAR: Regular rate and rhythm with bradycardia, S1, S2 normal. No murmurs, rubs, or gallops.  ABDOMEN: Soft, nondistended, nontender. Bowel sounds present.  No organomegaly or mass.  EXTREMITIES: No pedal edema, cyanosis, or clubbing.  NEUROLOGIC: Cranial nerves II through XII are intact. Muscle strength 5/5 in all extremities. Sensation intact. Gait not checked.  PSYCHIATRIC: The patient is alert and oriented x 3.  Normal affect and good eye contact. SKIN: No obvious rash, lesion, or ulcer.   LABORATORY PANEL:   CBC Recent Labs  Lab 05/26/22 1717  WBC 5.0  HGB 15.6  HCT 46.2  PLT 167   ------------------------------------------------------------------------------------------------------------------  Chemistries  Recent Labs  Lab 05/26/22 1717 05/26/22 1917  NA 140  --   K 3.7  --   CL 106  --   CO2 26  --   GLUCOSE 107*  --   BUN 10  --   CREATININE 0.70  --   CALCIUM 8.9  --   MG  --  2.2  AST 25  --   ALT 29  --   ALKPHOS 82  --   BILITOT 0.7  --    ------------------------------------------------------------------------------------------------------------------  Cardiac Enzymes No results for input(s): "TROPONINI" in the last 168 hours. ------------------------------------------------------------------------------------------------------------------  RADIOLOGY:  DG Chest 2 View  Result Date: 05/26/2022 CLINICAL DATA:  Chest pain.  Shortness of breath. EXAM: CHEST - 2 VIEW COMPARISON:  Chest x-ray February 14, 2020 FINDINGS: The heart size and mediastinal contours are within normal limits. Both lungs are clear. The visualized skeletal structures are unremarkable. IMPRESSION: No active cardiopulmonary disease. Electronically Signed   By: Gerome Sam III M.D.   On: 05/26/2022 18:18      IMPRESSION AND PLAN:  Assessment and Plan: * Symptomatic bradycardia - The patient will be admitted to a cardiac telemetry observation. - We will follow serial troponins. - Will obtain a 2D echo. - Cardiology consult will be obtained. - I notified Dr. Jayme Cloud about the patient. - Will utilize IV atropine as needed.  Chest  pain - We will follow serial troponins as mentioned above. - Cardiology consult to be obtained for further cardiac risk stratification. - 2D echo will be obtained. - He will be placed on as needed IV morphine sulfate for pain. - This likely secondary to his symptomatic sinus bradycardia.  Elevated blood pressure reading - We will place him on as needed IV hydralazine. - BP will be monitored.   DVT prophylaxis: Lovenox.. Advanced Care Planning:  Code Status: full code. Family Communication:  The plan of care was discussed in details with the patient (and family). I answered all questions. The patient agreed to proceed with the above mentioned plan. Further management will depend upon hospital course. Disposition Plan: Back to previous home environment Consults called: Cardiology. All the records are reviewed and case discussed with ED provider.  Status is: Observation  I certify that at the time of admission, it is my clinical judgment that the patient will require hospital care extending less than 2 midnights.                            Dispo: The  patient is from: Home              Anticipated d/c is to: Home              Patient currently is not medically stable to d/c.              Difficult to place patient: No  Hannah Beat M.D on 05/27/2022 at 1:30 AM  [facility]   From 7 PM-7 AM, contact night-coverage [email or web address]  CC: Primary care physician; Tally Joe, MD

## 2022-05-26 NOTE — ED Notes (Signed)
Per admitting provider, a manual blood pressure was done. Manual B/P was 158/98 on the RA.

## 2022-05-26 NOTE — ED Provider Notes (Signed)
[place] EMERGENCY DEPARTMENT AT [facility] Provider Note   CSN: [id number] Arrival date & time: 05/26/22  1554     History  No chief complaint on file.   Steven Daugherty is a 52 y.o. male.  HPI 52 year old male with history of recent peripheral nerve repair on the left arm presenting for chest pain, shortness of breath.  Patient states he was doing well.  On Friday he had a peripheral nerve repair over his left forearm after recent laceration.  This morning he woke up with intermittent sharp left-sided nonradiating chest pain.  Last for seconds to minutes at a time before resolving.  No precipitating factors or relieving factors.  He has no associated shortness of breath, he is never had pain like this before.  No pleuritic pain.  No history of DVT or PE.  No lower extremity edema.  He has mild swelling of his left arm near his surgery site, but no new weakness or numbness.  He is otherwise been at his baseline health.  He reports having a stress test several years ago, no other cardiac history.  No fevers or chills or cough.     Home Medications Prior to Admission medications   Medication Sig Start Date End Date Taking? Authorizing Provider  bacitracin ointment Apply 1 Application topically 2 (two) times daily. 05/12/22   Achille Rich, PA-C  cephALEXin (KEFLEX) 500 MG capsule Take 1 capsule (500 mg total) by mouth 3 (three) times daily. 05/12/22   Achille Rich, PA-C  chlorhexidine (HIBICLENS) 4 % external liquid Apply topically daily as needed. 08/03/20   Particia Nearing, PA-C      Allergies    Patient has no known allergies.    Review of Systems   Review of Systems  Respiratory:  Positive for shortness of breath.   Cardiovascular:  Positive for chest pain. Negative for leg swelling.  All other systems reviewed and are negative.   Physical Exam Updated Vital Signs BP (!) 119/97   Pulse (!) 44   Temp 98 F (36.7 C) (Oral)   Resp 17   Wt 79.4 kg   SpO2  100%   BMI 25.11 kg/m  Physical Exam Vitals and nursing note reviewed.  Constitutional:      General: He is not in acute distress.    Appearance: He is well-developed.  HENT:     Head: Normocephalic and atraumatic.     Nose: Nose normal.     Mouth/Throat:     Mouth: Mucous membranes are moist.     Pharynx: Oropharynx is clear.  Eyes:     Extraocular Movements: Extraocular movements intact.     Conjunctiva/sclera: Conjunctivae normal.     Pupils: Pupils are equal, round, and reactive to light.  Cardiovascular:     Rate and Rhythm: Normal rate and regular rhythm.     Pulses: Normal pulses.     Heart sounds: Normal heart sounds. No murmur heard. Pulmonary:     Effort: Pulmonary effort is normal. No respiratory distress.     Breath sounds: Normal breath sounds.  Abdominal:     Palpations: Abdomen is soft.     Tenderness: There is no abdominal tenderness. There is no guarding or rebound.  Musculoskeletal:        General: No swelling.     Cervical back: Neck supple.     Right lower leg: No edema.     Left lower leg: No edema.     Comments: Mild bruising of  the mid left forearm proximal to his surgery site.  No signs of cellulitis or abscess.  Skin:    General: Skin is warm and dry.     Capillary Refill: Capillary refill takes less than 2 seconds.  Neurological:     General: No focal deficit present.     Mental Status: He is alert and oriented to person, place, and time. Mental status is at baseline.  Psychiatric:        Mood and Affect: Mood normal.     ED Results / Procedures / Treatments   Labs (all labs ordered are listed, but only abnormal results are displayed) Labs Reviewed  COMPREHENSIVE METABOLIC PANEL - Abnormal; Notable for the following components:      Result Value   Glucose, Bld 107 (*)    Total Protein 6.4 (*)    All other components within normal limits  CBC WITH DIFFERENTIAL/PLATELET  D-DIMER, QUANTITATIVE  MAGNESIUM  TSH  TROPONIN I (HIGH  SENSITIVITY)  TROPONIN I (HIGH SENSITIVITY)    EKG None  Radiology DG Chest 2 View  Result Date: 05/26/2022 CLINICAL DATA:  Chest pain.  Shortness of breath. EXAM: CHEST - 2 VIEW COMPARISON:  Chest x-ray February 14, 2020 FINDINGS: The heart size and mediastinal contours are within normal limits. Both lungs are clear. The visualized skeletal structures are unremarkable. IMPRESSION: No active cardiopulmonary disease. Electronically Signed   By: Gerome Sam III M.D.   On: 05/26/2022 18:18    Procedures Procedures    Medications Ordered in ED Medications  hydrALAZINE (APRESOLINE) injection 10 mg (has no administration in time range)    ED Course/ Medical Decision Making/ A&P Clinical Course as of 05/26/22 2327  Sun May 26, 2022  1849 EKG sinus bradycardia rate 42, intervals are normal.  No block.  Baseline somewhat difficult to interpret, obvious subtle ST elevation in lead III and aVF as well as lead V2, possible early repolarization.  Appears slightly more prominent than prior EKG November 04, 2019. [JD]    Clinical Course User Index [JD] Fulton Reek, MD                             Medical Decision Making Amount and/or Complexity of Data Reviewed Labs: ordered. Radiology: ordered.  Risk Decision regarding hospitalization.   52 year old male presenting for chest pain, shortness of breath.  Vital signs notable for bradycardia and mild hypertension.  On exam he is currently asymptomatic.  However he had not been bradycardic into the 30s, during which he has chest pain, shortness of breath, presyncope.  EKG as noted in ED course with sinus bradycardia.  Lab work notable for negative troponin x 2, low concern for ACS.  Electrolytes, TSH were unremarkable.  D-dimer is negative as well, less consistent PE or DVT.  He is recent surgery site is healing well with only mild swelling.  Low concern for DVT given normal D-dimer.  CBC is unremarkable.  Chest x-ray reviewed as well  without signs of pneumonia, pulm edema, normal cardiac silhouette.  We discussed patient with cardiology, given his symptomatic bradycardia they recommend admission to medicine for echocardiogram, telemetry.  I discussed the patient with the hospitalist team and he was admitted for further management.        Final Clinical Impression(s) / ED Diagnoses Final diagnoses:  Chest pain, unspecified type    Rx / DC Orders ED Discharge Orders     None  Fulton Reek, MD 05/26/22 2327    Tegeler, Canary Brim, MD 05/27/22 [phone]

## 2022-05-26 NOTE — ED Triage Notes (Signed)
Patient reports that he awoke this am with sharp chest pain that he describes as intermittent. No cough, no fever, no pain with inspiration. Has some sob with same, no other associated symptoms.

## 2022-05-27 DIAGNOSIS — R079 Chest pain, unspecified: Secondary | ICD-10-CM | POA: Diagnosis not present

## 2022-05-27 DIAGNOSIS — R001 Bradycardia, unspecified: Secondary | ICD-10-CM | POA: Diagnosis not present

## 2022-05-27 DIAGNOSIS — R030 Elevated blood-pressure reading, without diagnosis of hypertension: Secondary | ICD-10-CM

## 2022-05-27 LAB — CBC
HCT: 44.9 % (ref 39.0–52.0)
Hemoglobin: 15.1 g/dL (ref 13.0–17.0)
MCH: 31.1 pg (ref 26.0–34.0)
MCHC: 33.6 g/dL (ref 30.0–36.0)
MCV: 92.4 fL (ref 80.0–100.0)
Platelets: 156 10*3/uL (ref 150–400)
RBC: 4.86 MIL/uL (ref 4.22–5.81)
RDW: 12.3 % (ref 11.5–15.5)
WBC: 5.5 10*3/uL (ref 4.0–10.5)
nRBC: 0 % (ref 0.0–0.2)

## 2022-05-27 LAB — HIV ANTIBODY (ROUTINE TESTING W REFLEX): HIV Screen 4th Generation wRfx: NONREACTIVE

## 2022-05-27 LAB — BASIC METABOLIC PANEL
Anion gap: 10 (ref 5–15)
BUN: 7 mg/dL (ref 6–20)
CO2: 24 mmol/L (ref 22–32)
Calcium: 8.8 mg/dL — ABNORMAL LOW (ref 8.9–10.3)
Chloride: 104 mmol/L (ref 98–111)
Creatinine, Ser: 0.7 mg/dL (ref 0.61–1.24)
GFR, Estimated: >60 mL/min (ref >60)
Glucose, Bld: 105 mg/dL — ABNORMAL HIGH (ref 70–99)
Potassium: 3.5 mmol/L (ref 3.5–5.1)
Sodium: 138 mmol/L (ref 135–145)

## 2022-05-27 LAB — LIPID PANEL
Cholesterol: 184 mg/dL (ref 0–200)
HDL: 37 mg/dL — ABNORMAL LOW (ref >40)
LDL Cholesterol: 120 mg/dL — ABNORMAL HIGH (ref 0–99)
Total CHOL/HDL Ratio: 5 RATIO
Triglycerides: 133 mg/dL (ref <150)
VLDL: 27 mg/dL (ref 0–40)

## 2022-05-27 NOTE — Assessment & Plan Note (Signed)
-   We will place him on as needed IV hydralazine. - BP will be monitored. 

## 2022-05-27 NOTE — ED Notes (Signed)
ED TO INPATIENT HANDOFF REPORT  ED Nurse Name and Phone #: Joselyn Glassman [phone])  S Name/Age/Gender Steven Daugherty Genre 52 y.o. male Room/Bed: 021C/021C  Code Status   Code Status: Full Code  Home/SNF/Other Home Patient oriented to: self, place, time, and situation Is this baseline? Yes   Triage Complete: Triage complete  Chief Complaint Symptomatic bradycardia [R00.1]  Triage Note Patient reports that he awoke this am with sharp chest pain that he describes as intermittent. No cough, no fever, no pain with inspiration. Has some sob with same, no other associated symptoms.    Allergies No Known Allergies  Level of Care/Admitting Diagnosis ED Disposition     ED Disposition  Admit   Condition  --   Comment  Hospital Area: [facility] [[id number]]  Level of Care: Telemetry Cardiac [103]  May place patient in observation at [facility] or [facility] if equivalent level of care is available:: No  Covid Evaluation: Asymptomatic - no recent exposure (last 10 days) testing not required  Diagnosis: Symptomatic bradycardia [654052]  Admitting Physician: Hannah Beat [[id number]]  Attending Physician: Hannah Beat [[id number]]          B Medical/Surgery History Past Medical History:  Diagnosis Date   Fatigue    Past Surgical History:  Procedure Laterality Date   FOOT SURGERY  2015   Arthritis   HERNIA REPAIR  2005   MOUTH SURGERY Right    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     A IV Location/Drains/Wounds Patient Lines/Drains/Airways Status     Active Line/Drains/Airways     Name Placement date Placement time Site Days   Peripheral IV 05/26/22 20 G Anterior;Proximal;Right Forearm 05/26/22  1715  Forearm  1            Intake/Output Last 24 hours No intake or output data in the 24 hours ending 05/27/22 0242  Labs/Imaging Results for orders placed or performed during the hospital encounter of 05/26/22 (from the past 48 hour(s))  CBC with  Differential     Status: None   Collection Time: 05/26/22  5:17 PM  Result Value Ref Range   WBC 5.0 4.0 - 10.5 K/uL   RBC 5.04 4.22 - 5.81 MIL/uL   Hemoglobin 15.6 13.0 - 17.0 g/dL   HCT 78.4 69.6 - 29.5 %   MCV 91.7 80.0 - 100.0 fL   MCH 31.0 26.0 - 34.0 pg   MCHC 33.8 30.0 - 36.0 g/dL   RDW 28.4 13.2 - 44.0 %   Platelets 167 150 - 400 K/uL   nRBC 0.0 0.0 - 0.2 %   Neutrophils Relative % 45 %   Neutro Abs 2.3 1.7 - 7.7 K/uL   Lymphocytes Relative 41 %   Lymphs Abs 2.1 0.7 - 4.0 K/uL   Monocytes Relative 11 %   Monocytes Absolute 0.5 0.1 - 1.0 K/uL   Eosinophils Relative 2 %   Eosinophils Absolute 0.1 0.0 - 0.5 K/uL   Basophils Relative 1 %   Basophils Absolute 0.1 0.0 - 0.1 K/uL   Immature Granulocytes 0 %   Abs Immature Granulocytes 0.01 0.00 - 0.07 K/uL    Comment: Performed at [facility], [address], [place], [place] [address]  Comprehensive metabolic panel     Status: Abnormal   Collection Time: 05/26/22  5:17 PM  Result Value Ref Range   Sodium 140 135 - 145 mmol/L   Potassium 3.7 3.5 - 5.1 mmol/L   Chloride 106 98 -  111 mmol/L   CO2 26 22 - 32 mmol/L   Glucose, Bld 107 (H) 70 - 99 mg/dL    Comment: Glucose reference range applies only to samples taken after fasting for at least 8 hours.   BUN 10 6 - 20 mg/dL   Creatinine, Ser 8.29 0.61 - 1.24 mg/dL   Calcium 8.9 8.9 - 56.2 mg/dL   Total Protein 6.4 (L) 6.5 - 8.1 g/dL   Albumin 4.0 3.5 - 5.0 g/dL   AST 25 15 - 41 U/L   ALT 29 0 - 44 U/L   Alkaline Phosphatase 82 38 - 126 U/L   Total Bilirubin 0.7 0.3 - 1.2 mg/dL   GFR, Estimated >13 >08 mL/min    Comment: (NOTE) Calculated using the CKD-EPI Creatinine Equation (2021)    Anion gap 8 5 - 15    Comment: Performed at [facility], [address], [place], [place] [address]  Troponin I (High Sensitivity)     Status: None   Collection Time: 05/26/22  5:17 PM  Result Value Ref Range   Troponin I (High Sensitivity) 5 <18 ng/L    Comment:  (NOTE) Elevated high sensitivity troponin I (hsTnI) values and significant  changes across serial measurements may suggest ACS but many other  chronic and acute conditions are known to elevate hsTnI results.  Refer to the "Links" section for chest pain algorithms and additional  guidance. Performed at [facility], [address], [place], [place] [address]   D-dimer, quantitative     Status: None   Collection Time: 05/26/22  5:17 PM  Result Value Ref Range   D-Dimer, Quant 0.36 0.00 - 0.50 ug/mL-FEU    Comment: (NOTE) At the manufacturer cut-off value of 0.5 g/mL FEU, this assay has a negative predictive value of 95-100%.This assay is intended for use in conjunction with a clinical pretest probability (PTP) assessment model to exclude pulmonary embolism (PE) and deep venous thrombosis (DVT) in outpatients suspected of PE or DVT. Results should be correlated with clinical presentation. Performed at [facility], [address], [place], [place] [address]   Troponin I (High Sensitivity)     Status: None   Collection Time: 05/26/22  7:17 PM  Result Value Ref Range   Troponin I (High Sensitivity) 6 <18 ng/L    Comment: (NOTE) Elevated high sensitivity troponin I (hsTnI) values and significant  changes across serial measurements may suggest ACS but many other  chronic and acute conditions are known to elevate hsTnI results.  Refer to the "Links" section for chest pain algorithms and additional  guidance. Performed at [facility], [address], [place], [place] [address]   Magnesium     Status: None   Collection Time: 05/26/22  7:17 PM  Result Value Ref Range   Magnesium 2.2 1.7 - 2.4 mg/dL    Comment: Performed at [facility], [address], [place], [place] [address]  TSH     Status: None   Collection Time: 05/26/22  7:17 PM  Result Value Ref Range   TSH 2.041 0.350 - 4.500 uIU/mL    Comment: Performed by a 3rd Generation assay with a functional  sensitivity of <=0.01 uIU/mL. Performed at [facility], [address], [place], [place] [address]    DG Chest 2 View  Result Date: 05/26/2022 CLINICAL DATA:  Chest pain.  Shortness of breath. EXAM: CHEST - 2 VIEW COMPARISON:  Chest x-ray February 14, 2020 FINDINGS: The heart size and mediastinal contours are within normal limits. Both lungs are clear. The visualized skeletal structures are unremarkable. IMPRESSION: No active cardiopulmonary disease. Electronically Signed   By: Gerome Sam III M.D.   On: 05/26/2022 18:18    Pending Labs Unresulted Labs (From admission, onward)     Start     Ordered   05/27/22 0500  Basic metabolic panel  Tomorrow morning,   R        05/26/22 2343   05/27/22 0500  CBC  Tomorrow morning,   R        05/26/22 2343   05/26/22 2335  HIV Antibody (routine testing w rflx)  (HIV Antibody (Routine testing w reflex) panel)  Once,   R        05/26/22 2343            Vitals/Pain Today's Vitals   05/26/22 2000 05/26/22 2100 05/26/22 2200 05/27/22 0145  BP: (!) 151/93 (!) 131/97 (!) 119/97 (!) 129/90  Pulse: (!) 47 (!) 45 (!) 44 (!) 49  Resp: 13 19 17 16   Temp:      TempSrc:      SpO2: 100% 100% 100% 100%  Weight:      PainSc:        Isolation Precautions No active isolations  Medications Medications  hydrALAZINE (APRESOLINE) injection 10 mg (has no administration in time range)  enoxaparin (LOVENOX) injection 40 mg (has no administration in time range)  0.9 %  sodium chloride infusion (has no administration in time range)  acetaminophen (TYLENOL) tablet 650 mg (has no administration in time range)    Or  acetaminophen (TYLENOL) suppository 650 mg (has no administration in time range)  traZODone (DESYREL) tablet 25 mg (has no administration in time range)  magnesium hydroxide (MILK OF MAGNESIA) suspension 30 mL (has no administration in time range)  ondansetron (ZOFRAN) tablet 4 mg (has no administration in time range)    Or   ondansetron (ZOFRAN) injection 4 mg (has no administration in time range)    Mobility walks     Focused Assessments Cardiac Assessment Handoff:  Cardiac Rhythm: Sinus bradycardia No results found for: "CKTOTAL", "CKMB", "CKMBINDEX", "TROPONINI" Lab Results  Component Value Date   DDIMER 0.36 05/26/2022   Does the Patient currently have chest pain? No    R Recommendations: See Admitting Provider Note  Report given to:   Additional Notes:

## 2022-05-27 NOTE — Discharge Summary (Signed)
Steven Daugherty ZOX:[id number] DOB: [birth year] DOA: 05/26/2022  PCP: Tally Joe, MD  Admit date: 05/26/2022 Discharge date: 05/27/2022  Time spent: 35 minutes  Recommendations for Outpatient Follow-up:  Cardiology f/u      Discharge Diagnoses:  Principal Problem:   Chest pain Active Problems:   Sinus bradycardia   Elevated blood pressure reading   Discharge Condition: stable  Diet recommendation: regular  Filed Weights   05/26/22 1633 05/27/22 0436  Weight: 79.4 kg 81.4 kg    History of present illness:  From admission h and p Steven Daugherty is a 52 y.o. male with no significant medical history, who presented to the emergency room with acute onset of left parasternal chest pain felt as a sharp pain with dyspnea with associated palpitations that he felt in his ears.  No associated nausea or vomiting or diaphoresis.  No leg pain or edema or recent travels or surgeries.  No fever or chills.  No cough or wheezing or hemoptysis.  No dysuria, oliguria or hematuria or flank pain.  No bleeding diathesis.  The patient had peripheral nerve repair on the left wrist on Friday after having a left wrist injury and laceration.    Hospital Course:  Patient presents with chest pain. Chronic problem more frequent last few days. Non-exertional episodes of very brief (lasting seconds) left chest pain. Has excellent exercise tolerance. Normal echo and coronary CT in 2021. Here bradycardic but notes his HR responds to 160s when working out at the gym. Dimer wnl, cxr clear, trops negative. Evaluated by cardiology, this felt to be non-cardiac, etiology unclear but low concern for other serious etiology like PE or aortic dissection. F/u cardiology later this month.   Procedures: none   Consultations: cardiology  Discharge Exam: Vitals:   05/27/22 0753 05/27/22 1027  BP: 119/85 119/89  Pulse: (!) 50 (!) 44  Resp: 18 19  Temp: 98.6 F (37 C) 97.6 F (36.4 C)  SpO2: 96% 97%    General:  NAD Cardiovascular: bradycardic Respiratory: normal WOB  Discharge Instructions   Discharge Instructions     Diet - low sodium heart healthy   Complete by: As directed    Increase activity slowly   Complete by: As directed       Allergies as of 05/27/2022   No Known Allergies      Medication List     STOP taking these medications    cephALEXin 500 MG capsule Commonly known as: KEFLEX       TAKE these medications    acetaminophen 500 MG tablet Commonly known as: TYLENOL Take 500 mg by mouth daily as needed.   bacitracin ointment Apply 1 Application topically 2 (two) times daily.   ibuprofen 200 MG tablet Commonly known as: ADVIL Take 200 mg by mouth daily as needed for moderate pain.       No Known Allergies    The results of significant diagnostics from this hospitalization (including imaging, microbiology, ancillary and laboratory) are listed below for reference.    Significant Diagnostic Studies: DG Chest 2 View  Result Date: 05/26/2022 CLINICAL DATA:  Chest pain.  Shortness of breath. EXAM: CHEST - 2 VIEW COMPARISON:  Chest x-ray February 14, 2020 FINDINGS: The heart size and mediastinal contours are within normal limits. Both lungs are clear. The visualized skeletal structures are unremarkable. IMPRESSION: No active cardiopulmonary disease. Electronically Signed   By: Gerome Sam III M.D.   On: 05/26/2022 18:18   DG Forearm Left  Result Date: 05/12/2022 CLINICAL  DATA:  Laceration.  Chain saw accident. EXAM: LEFT FOREARM - 2 VIEW COMPARISON:  None Available. FINDINGS: No evidence for an acute fracture. No cortical disruption in the distal forearm. Skin laceration noted distally. No evidence for retained radiopaque soft tissue foreign body. IMPRESSION: No acute bony abnormality. No evidence for radiopaque soft tissue foreign body. Electronically Signed   By: Kennith Center M.D.   On: 05/12/2022 13:25    Microbiology: No results found for this or any  previous visit (from the past 240 hour(s)).   Labs: Basic Metabolic Panel: Recent Labs  Lab 05/26/22 1717 05/26/22 1917 05/27/22 0314  NA 140  --  138  K 3.7  --  3.5  CL 106  --  104  CO2 26  --  24  GLUCOSE 107*  --  105*  BUN 10  --  7  CREATININE 0.70  --  0.70  CALCIUM 8.9  --  8.8*  MG  --  2.2  --    Liver Function Tests: Recent Labs  Lab 05/26/22 1717  AST 25  ALT 29  ALKPHOS 82  BILITOT 0.7  PROT 6.4*  ALBUMIN 4.0   No results for input(s): "LIPASE", "AMYLASE" in the last 168 hours. No results for input(s): "AMMONIA" in the last 168 hours. CBC: Recent Labs  Lab 05/26/22 1717 05/27/22 0314  WBC 5.0 5.5  NEUTROABS 2.3  --   HGB 15.6 15.1  HCT 46.2 44.9  MCV 91.7 92.4  PLT 167 156   Cardiac Enzymes: No results for input(s): "CKTOTAL", "CKMB", "CKMBINDEX", "TROPONINI" in the last 168 hours. BNP: BNP (last 3 results) No results for input(s): "BNP" in the last 8760 hours.  ProBNP (last 3 results) No results for input(s): "PROBNP" in the last 8760 hours.  CBG: No results for input(s): "GLUCAP" in the last 168 hours.     Signed:  Silvano Bilis MD.  [facility] 05/27/2022, 2:24 PM

## 2022-05-27 NOTE — Plan of Care (Signed)

## 2022-05-27 NOTE — Consult Note (Addendum)
Cardiology Consultation   Patient ID: Steven Daugherty MRN: [id number]; DOB: 04/20/1970  Admit date: 05/26/2022 Date of Consult: 05/27/2022  PCP:  Tally Joe, MD   [facility] Providers Cardiologist:  Parke Poisson, MD        Patient Profile:   Steven Daugherty is a 52 y.o. male medical history of hyperlipidemia who is being seen 05/27/2022 for the evaluation of chest pain and shortness of breath at the request of Dr. Arville Care.  History of Present Illness:   Steven Daugherty presented to the emergency department yesterday for evaluation of recurrent but brief, sharp chest discomfort.  Patient says that he has intermittent occurrence of what he describes as a sharp/stabbing chest discomfort.  Duration of pain is about 15 seconds.  Patient says that these typically do not concern him because they are so isolated and rare.  The symptoms are in no way associated with exertion, physical positioning/movement, time of day, food.  Patient says that yesterday was abnormal because he awoke in the morning with the sharp but brief discomfort and became more concerned when he continued to have recurrent episodes throughout the day.  Does report that this pain is similar to some of the discomfort for which she was previously evaluated by Dr. Jacques Navy for in February 2021.  At that time patient reported chest tightness experienced both at rest and with exertion.  He described a sharp pain on the left side of his chest that would wake him up at night. Patient also reported a separate symptom of chest pressure and tightness that occurred with exertion, described as a sensation of not being able to take a full breath.  This discomfort started while running or biking and resolved within 15 minutes of resting.  Patient underwent both an echocardiogram as well as coronary CTA and both were without significant abnormalities.  Patient's calcium score was 0.  Today patient clarifies that he has not have any of the  latter type of pain, no exertional limitation, no shortness of breath with activities.  Patient continues to be very active and does notable aerobic activity at the gym as well as outdoor activities such as canoeing and hiking.  Patient with persistently low heart rate on telemetry, but denies any regular sensation of dizziness or lightheadedness.  He says that the only times he has noticed feeling this way are when he has not been good about eating or drinking.  Patient says that he does often check his heart rate while he is on fitness equipment at the gym and frequently sees heart rates into the 160s.   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 Authorizing Provider  acetaminophen (TYLENOL) 500 MG tablet Take 500 mg by mouth daily as needed.   Yes [provider]  ibuprofen (ADVIL) 200 MG tablet Take 200 mg by mouth daily as needed for moderate pain.   Yes [provider]  bacitracin ointment Apply 1 Application topically 2 (two) times daily. 05/12/22   Achille Rich, PA-C  cephALEXin (KEFLEX) 500 MG capsule Take 1 capsule (500 mg total) by mouth 3 (three) times daily. Patient not taking: Reported on  05/26/2022 05/12/22   Achille Rich, PA-C    Inpatient Medications: Scheduled Meds:  enoxaparin (LOVENOX) injection  40 mg Subcutaneous Q24H   Continuous Infusions:  sodium chloride 100 mL/hr at 05/27/22 0315   PRN Meds: acetaminophen **OR** acetaminophen, hydrALAZINE, magnesium hydroxide, ondansetron **OR** ondansetron (ZOFRAN) IV, traZODone  Allergies:   No Known Allergies  Social History:   Social History   Socioeconomic History   Marital status: Married    Spouse name: [name]   Number  of children: 0   Years of education: 20   Highest education level: Not on file  Occupational History   Occupation: [employer]  Tobacco Use   Smoking status: Never   Smokeless tobacco: Never  Substance and Sexual Activity   Alcohol use: Yes    Alcohol/week: 1.0 standard drink of alcohol    Types: 1 Standard drinks or equivalent per week    Comment: daily- 1 drink   Drug use: No   Sexual activity: Not on file  Other Topics Concern   Not on file  Social History Narrative   Lives with wife   Caffeine use: 2 cups/day- coffee   Social Determinants of Health   Financial Resource Strain: Not on file  Food Insecurity: No Food Insecurity (05/26/2022)   Hunger Vital Sign    Worried About Running Out of Food in the Last Year: Never true    Ran Out of Food in the Last Year: Never true  Transportation Needs: No Transportation Needs (05/26/2022)   PRAPARE - Administrator, Civil Service (Medical): No    Lack of Transportation (Non-Medical): No  Physical Activity: Not on file  Stress: Not on file  Social Connections: Not on file  Intimate Partner Violence: Not At Risk (05/26/2022)   Humiliation, Afraid, Rape, and Kick questionnaire    Fear of Current or Ex-Partner: No    Emotionally Abused: No    Physically Abused: No    Sexually Abused: No    Family History:    Family History  Problem Relation Age of Onset   Dementia Neg Hx      ROS:  Please see the history of present illness.   All other ROS reviewed and negative.     Physical Exam/Data:   Vitals:   05/27/22 0322 05/27/22 0436 05/27/22 0753 05/27/22 1027  BP:  (!) 133/96 119/85 119/89  Pulse: (!) 53 (!) 40 (!) 50 (!) 44  Resp: 18 17 18 19   Temp: 98.2 F (36.8 C) 97.9 F (36.6 C) 98.6 F (37 C) 97.6 F (36.4 C)  TempSrc: Oral Oral Oral Oral  SpO2: 100%  96% 97%  Weight:  81.4 kg    Height:  5\' 10"  (1.778 m)      Intake/Output Summary (Last 24 hours) at 05/27/2022 1157 Last data filed at 05/27/2022 0754 Gross  per 24 hour  Intake 588.44 ml  Output --  Net 588.44 ml      05/27/2022    4:36 AM 05/26/2022    4:33 PM 05/12/2022   12:25 PM  Last 3 Weights  Weight (lbs) 179 lb 7.3 oz 175 lb 181 lb 14.1 oz  Weight (kg) 81.4 kg 79.379 kg 82.5 kg     Body mass index is 25.75 kg/m.  General:  Well nourished, well developed, in no acute distress HEENT: normal Neck: no JVD Vascular: No carotid bruits; Distal pulses 2+ bilaterally Cardiac:  normal S1, S2; RRR; no murmur  Lungs:  clear to auscultation bilaterally, no wheezing,  rhonchi or rales  Abd: soft, nontender, no hepatomegaly  Ext: no edema Musculoskeletal:  No deformities, BUE and BLE movement normal and equal.  Patient's left hand through elbow wrapped in dressing status post surgery on Friday. Skin: warm and dry  Neuro:  CNs 2-12 intact, no focal abnormalities noted Psych:  Normal affect   EKG:  The EKG was personally reviewed and demonstrates:  sinus bradycardia Telemetry:  Telemetry was personally reviewed and demonstrates: Sinus bradycardia, heart rate between upper 30s and upper 50s.  Relevant CV Studies:   03/22/19 Cardiac CTA  IMPRESSION: 1. No evidence of CAD, CADRADS = 0.   2. Coronary calcium score of 0. This was 0 percentile for age and sex matched control.   3. Normal coronary origin with right dominance.   4. 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  03/19/19 TTE  IMPRESSIONS     1. Left ventricular ejection fraction, by estimation, is 55 to 60%. The  left ventricle has normal function. The left ventricle has no regional  wall motion abnormalities. Left ventricular diastolic parameters are  consistent with Grade I diastolic  dysfunction (impaired relaxation).   2. Right ventricular systolic function is normal. The right ventricular  size is normal. There is normal pulmonary artery systolic pressure.   3. The mitral valve is normal in structure and function. No evidence of  mitral valve regurgitation.    4. The aortic valve is normal in structure and function. Aortic valve  regurgitation is trivial.   5. The inferior vena cava is normal in size with greater than 50%  respiratory variability, suggesting right atrial pressure of 3 mmHg.   FINDINGS   Left Ventricle: Left ventricular ejection fraction, by estimation, is 55  to 60%. The left ventricle has normal function. The left ventricle has no  regional wall motion abnormalities. The left ventricular internal cavity  size was normal in size. There is   no left ventricular hypertrophy. Left ventricular diastolic parameters  are consistent with Grade I diastolic dysfunction (impaired relaxation).   Right Ventricle: The right ventricular size is normal. No increase in  right ventricular wall thickness. Right ventricular systolic function is  normal. There is normal pulmonary artery systolic pressure. The tricuspid  regurgitant velocity is 1.99 m/s, and   with an assumed right atrial pressure of 3 mmHg, the estimated right  ventricular systolic pressure is 18.8 mmHg.   Left Atrium: Left atrial size was normal in size.   Right Atrium: Right atrial size was normal in size.   Pericardium: There is no evidence of pericardial effusion.   Mitral Valve: The mitral valve is normal in structure and function. No  evidence of mitral valve regurgitation.   Tricuspid Valve: The tricuspid valve is normal in structure. Tricuspid  valve regurgitation is trivial.   Aortic Valve: The aortic valve is normal in structure and function. Aortic  valve regurgitation is trivial. Aortic regurgitation PHT measures 1280  msec.   Pulmonic Valve: The pulmonic valve was normal in structure. Pulmonic valve  regurgitation is trivial.   Aorta: The aortic root is normal in size and structure.   Venous: The inferior vena cava is normal in size with greater than 50%  respiratory variability, suggesting right atrial pressure of 3 mmHg.   IAS/Shunts: No atrial level  shunt detected by color flow Doppler.   Laboratory Data:  High Sensitivity Troponin:   Recent Labs  Lab 05/26/22 1717 05/26/22 1917  TROPONINIHS 5 6  Chemistry Recent Labs  Lab 05/26/22 1717 05/26/22 1917 05/27/22 0314  NA 140  --  138  K 3.7  --  3.5  CL 106  --  104  CO2 26  --  24  GLUCOSE 107*  --  105*  BUN 10  --  7  CREATININE 0.70  --  0.70  CALCIUM 8.9  --  8.8*  MG  --  2.2  --   GFRNONAA >60  --  >60  ANIONGAP 8  --  10    Recent Labs  Lab 05/26/22 1717  PROT 6.4*  ALBUMIN 4.0  AST 25  ALT 29  ALKPHOS 82  BILITOT 0.7   Lipids No results for input(s): "CHOL", "TRIG", "HDL", "LABVLDL", "LDLCALC", "CHOLHDL" in the last 168 hours.  Hematology Recent Labs  Lab 05/26/22 1717 05/27/22 0314  WBC 5.0 5.5  RBC 5.04 4.86  HGB 15.6 15.1  HCT 46.2 44.9  MCV 91.7 92.4  MCH 31.0 31.1  MCHC 33.8 33.6  RDW 12.1 12.3  PLT 167 156   Thyroid  Recent Labs  Lab 05/26/22 1917  TSH 2.041    BNPNo results for input(s): "BNP", "PROBNP" in the last 168 hours.  DDimer  Recent Labs  Lab 05/26/22 1717  DDIMER 0.36     Radiology/Studies:  DG Chest 2 View  Result Date: 05/26/2022 CLINICAL DATA:  Chest pain.  Shortness of breath. EXAM: CHEST - 2 VIEW COMPARISON:  Chest x-ray February 14, 2020 FINDINGS: The heart size and mediastinal contours are within normal limits. Both lungs are clear. The visualized skeletal structures are unremarkable. IMPRESSION: No active cardiopulmonary disease. Electronically Signed   By: Gerome Sam III M.D.   On: 05/26/2022 18:18     Assessment and Plan:   Chest pain  Patient presented to the emergency department yesterday due to increased frequency of atypical chest pain.  Patient reports a sharp/stabbing discomfort lasting about 15 seconds occurring intermittently throughout the day yesterday.  Not associated with activity, food, body position etc. Troponin negative x2.  Patient continues to tolerate regular physical  activity without any limitations or anginal symptoms.  Given this as well as cardiac CTA without any obstruction/calcium score zero 3 years ago, very low suspicion for CAD. Unlikely that this would be caused by coronary vasospasms either given negative troponin and lack of tobacco use/frequent caffeine consumption. Question msk vs nerve pain etiology. Hospital med H&P mentioned TTE but not ordered. This is unlikely to show significant change from patient's normal study 3 years ago and repeat could be considered in outpatient setting vs inpatient. Cardiology will plan to follow up with patient in outpatient clinic.  Bradycardia  Both telemetry and ECG indicate sinus bradycardia.  Patient gets regular physical activity and denies exertional limitation.  Additionally he reports noticing heart rate increased into the 160s with heavy exertion at the gym.  Suspect that this is physiologic bradycardia and given that patient appears to have appropriate chronotropic compensation with increased exertion, very low suspicion that this correlates with patient's atypical chest discomfort.  Hyperlipidemia  Patient last had LDL checked in 2021, was 112 at that time.  Given family history of heart disease, will request add on lipid panel to see if patient has had improvement of cholesterol with lifestyle and dietary modification.  If increased, low threshold to initiate statin therapy.  Lab Results  Component Value Date   LDLCALC 112 (H) 03/22/2019   Hypertension  Patient with borderline BP this admission. Would favor  reassessing in clinic vs initiation of anti-hypertensive agent this admission.  Risk Assessment/Risk Scores:                For questions or updates, please contact [facility] Please consult [email or web address] for contact info under    Signed, Perlie Gold, PA-C  05/27/2022 11:57 AM

## 2022-05-27 NOTE — Assessment & Plan Note (Signed)
-   We will follow serial troponins as mentioned above. - Cardiology consult to be obtained for further cardiac risk stratification. - 2D echo will be obtained. - He will be placed on as needed IV morphine sulfate for pain. - This likely secondary to his symptomatic sinus bradycardia.

## 2022-05-27 NOTE — Progress Notes (Signed)
Patient arrived to 3E12 from ED. AO x4. Denies any pain or discomfort at the time of transfer. CHG bath completed. Connected to tele and CCMD notified. Oriented to room and call bell system. Plan of care continues.

## 2022-05-27 NOTE — Assessment & Plan Note (Deleted)
-   We will place him on as needed IV hydralazine. - BP will be monitored.

## 2022-05-27 NOTE — ED Notes (Signed)
Pt transferred to inpatient unit via stretcher with all belongings. 

## 2022-05-27 NOTE — TOC Transition Note (Signed)
Transition of Care [facility]) - CM/SW Discharge Note   Patient Details  Name: Steven Daugherty MRN: [id number] Date of Birth: [birth year]  [facility] CM/SW Contact:  Tom-Johnson, Hershal Coria, RN Phone Number: 05/27/2022, 3:10 PM   Clinical Narrative:     Patient is scheduled for discharge today.  Outpatient f/u and discharge instructions on AVS. No TOC needs or recommendations noted. Wife, [name] to transport at discharge.  No further TOC needs noted.       Final next level of care: Home/Self Care Barriers to Discharge: Barriers Resolved   Patient Goals and CMS Choice CMS Medicare.gov Compare Post Acute Care list provided to:: Patient Choice offered to / list presented to : NA  Discharge Placement                  Patient to be transferred to facility by: Wife Name of family member notified: [name]    Discharge Plan and Services Additional resources added to the After Visit Summary for                  DME Arranged: N/A DME Agency: NA       HH Arranged: NA HH Agency: NA        Social Determinants of Health (SDOH) Interventions SDOH Screenings   Food Insecurity: No Food Insecurity (05/26/2022)  Housing: Low Risk  (05/26/2022)  Transportation Needs: No Transportation Needs (05/26/2022)  Utilities: Not At Risk (05/26/2022)  Tobacco Use: Low Risk  (05/26/2022)     Readmission Risk Interventions     No data to display

## 2022-05-27 NOTE — Assessment & Plan Note (Addendum)
-   The patient will be admitted to a cardiac telemetry observation. - We will follow serial troponins. - Will obtain a 2D echo. - Cardiology consult will be obtained. - I notified Dr. Jayme Cloud about the patient. - Will utilize IV atropine as needed.

## 2022-06-11 NOTE — Progress Notes (Signed)
Cardiology Clinic Note   Patient Name: Steven Daugherty Date of Encounter: 06/12/2022  Primary Care Provider:  Tally Joe, MD Primary Cardiologist:  Parke Poisson, MD  Patient Profile    Steven Daugherty 52 year old male presents the clinic today for follow-up evaluation of his chest discomfort and sinus bradycardia.  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  He presented to the emergency department on 05/27/2022 with complaints of chest discomfort.  He described it as sharp.  He also reported dyspnea and palpitations.  He denied nausea and vomiting.  He denied leg pain, lower extremity swelling, and recent travel or surgeries.  He did note that he had chronic chest discomfort which had become more frequent over the previous few days.  His chest discomfort was nonexertional and lasted for a few seconds.  It was located on the left side of his chest.  He had an excellent exercise tolerance.  His echocardiogram was normal and he had a normal CT in 2021.  On exam he was noted to be bradycardic.  He reported heart rates in the 160s while working on the gym.  His D-dimer was normal.  Chest x-ray was clear, cardiac troponins were negative.  He was evaluated by cardiology.  Cardiology felt the symptoms to be noncardiac.  Etiology was unclear.  Low concern for PE or aortic dissection.  His blood pressure was noted to be 119/85.  Outpatient cardiology follow-up was recommended.  He presents to the clinic today for follow-up evaluation and states he has not had any further episodes of chest discomfort or accelerated heart rate.  He has  returned to using his stair climber 2 times per week and also enjoys hiking.  He denies exertional chest pain.  We reviewed his emergency department visit and he expressed understanding.  We reviewed his previous echocardiogram.  He continues to heal from his left forearm laceration.  I will have him continue his physical activity as tolerated and plan follow-up as needed.  Today he denies chest pain, shortness of breath, lower extremity edema, fatigue, palpitations, melena, hematuria, hemoptysis, diaphoresis, weakness, presyncope, syncope, orthopnea, and PND.   Home Medications    Prior to Admission medications   Medication Sig Start Date End Date Taking? Authorizing Provider  acetaminophen (TYLENOL) 500 MG tablet Take 500 mg by mouth daily as needed.    [provider]  bacitracin ointment Apply 1 Application topically 2 (two) times daily. 05/12/22   Achille Rich, PA-C  ibuprofen (ADVIL) 200 MG tablet Take 200 mg by mouth daily as needed for moderate pain.    [provider]    Family History    Family History  Problem Relation Age of Onset   Dementia Neg Hx    He indicated that his mother is deceased. He indicated that his father is deceased. He indicated that the status of his neg hx is unknown.  Social History    Social History   Socioeconomic History   Marital status: Married    Spouse name: [name]   Number of children: 0  Years of education: 20   Highest education level: Not on file  Occupational History   Occupation: [employer]  Tobacco Use   Smoking status: Never   Smokeless tobacco: Never  Substance and Sexual Activity   Alcohol use: Yes    Alcohol/week: 1.0 standard drink of alcohol    Types: 1 Standard drinks or equivalent per week    Comment: daily- 1 drink   Drug use: No   Sexual activity: Not on file  Other Topics Concern   Not on file  Social History Narrative   Lives with wife   Caffeine use: 2 cups/day- coffee   Social Determinants of  Health   Financial Resource Strain: Not on file  Food Insecurity: No Food Insecurity (05/26/2022)   Hunger Vital Sign    Worried About Running Out of Food in the Last Year: Never true    Ran Out of Food in the Last Year: Never true  Transportation Needs: No Transportation Needs (05/26/2022)   PRAPARE - Administrator, Civil Service (Medical): No    Lack of Transportation (Non-Medical): No  Physical Activity: Not on file  Stress: Not on file  Social Connections: Not on file  Intimate Partner Violence: Not At Risk (05/26/2022)   Humiliation, Afraid, Rape, and Kick questionnaire    Fear of Current or Ex-Partner: No    Emotionally Abused: No    Physically Abused: No    Sexually Abused: No     Review of Systems    General:  No chills, fever, night sweats or weight changes.  Cardiovascular:  No chest pain, dyspnea on exertion, edema, orthopnea, palpitations, paroxysmal nocturnal dyspnea. Dermatological: No rash, lesions/masses Respiratory: No cough, dyspnea Urologic: No hematuria, dysuria Abdominal:   No nausea, vomiting, diarrhea, bright red blood per rectum, melena, or hematemesis Neurologic:  No visual changes, wkns, changes in mental status. All other systems reviewed and are otherwise negative except as noted above.  Physical Exam    VS:  BP 110/80 (BP Location: Left Arm, Patient Position: Sitting, Cuff Size: Normal)   Pulse (!) 48   Ht 5\' 10"  (1.778 m)   Wt 181 lb 3.2 oz (82.2 kg)   SpO2 97%   BMI 26.00 kg/m  , BMI Body mass index is 26 kg/m. GEN: Well nourished, well developed, in no acute distress. HEENT: normal. Neck: Supple, no JVD, carotid bruits, or masses. Cardiac: RRR, no murmurs, rubs, or gallops. No clubbing, cyanosis, edema.  Radials/DP/PT 2+ and equal bilaterally.  Respiratory:  Respirations regular and unlabored, clear to auscultation bilaterally. GI: Soft, nontender, nondistended, BS + x 4. MS: no deformity or atrophy. Skin: warm and dry, no  rash.  Left forearm incision healing well no signs of infection Neuro:  Strength and sensation are intact. Psych: Normal affect.  Accessory Clinical Findings    Recent Labs: 05/26/2022: ALT 29; Magnesium 2.2; TSH 2.041 05/27/2022: BUN 7; Creatinine, Ser 0.70; Hemoglobin 15.1; Platelets 156; Potassium 3.5; Sodium 138   Recent Lipid Panel    Component Value Date/Time   CHOL 184 05/27/2022 0330   CHOL 170 03/22/2019 0839   TRIG 133 05/27/2022 0330   HDL 37 (L) 05/27/2022 0330   HDL 44 03/22/2019 0839   CHOLHDL 5.0 05/27/2022 0330   VLDL 27 05/27/2022 0330   LDLCALC 120 (H) 05/27/2022 0330   LDLCALC 112 (H) 03/22/2019 0839         ECG personally reviewed by me today-none today.   Echocardiogram 03/19/2019  IMPRESSIONS  1. Left ventricular ejection fraction, by estimation, is 55 to 60%. The  left ventricle has normal function. The left ventricle has no regional  wall motion abnormalities. Left ventricular diastolic parameters are  consistent with Grade I diastolic  dysfunction (impaired relaxation).   2. Right ventricular systolic function is normal. The right ventricular  size is normal. There is normal pulmonary artery systolic pressure.   3. The mitral valve is normal in structure and function. No evidence of  mitral valve regurgitation.   4. The aortic valve is normal in structure and function. Aortic valve  regurgitation is trivial.   5. The inferior vena cava is normal in size with greater than 50%  respiratory variability, suggesting right atrial pressure of 3 mmHg.   FINDINGS   Left Ventricle: Left ventricular ejection fraction, by estimation, is 55  to 60%. The left ventricle has normal function. The left ventricle has no  regional wall motion abnormalities. The left ventricular internal cavity  size was normal in size. There is   no left ventricular hypertrophy. Left ventricular diastolic parameters  are consistent with Grade I diastolic dysfunction  (impaired relaxation).   Right Ventricle: The right ventricular size is normal. No increase in  right ventricular wall thickness. Right ventricular systolic function is  normal. There is normal pulmonary artery systolic pressure. The tricuspid  regurgitant velocity is 1.99 m/s, and   with an assumed right atrial pressure of 3 mmHg, the estimated right  ventricular systolic pressure is 18.8 mmHg.   Left Atrium: Left atrial size was normal in size.   Right Atrium: Right atrial size was normal in size.   Pericardium: There is no evidence of pericardial effusion.   Mitral Valve: The mitral valve is normal in structure and function. No  evidence of mitral valve regurgitation.   Tricuspid Valve: The tricuspid valve is normal in structure. Tricuspid  valve regurgitation is trivial.   Aortic Valve: The aortic valve is normal in structure and function. Aortic  valve regurgitation is trivial. Aortic regurgitation PHT measures 1280  msec.   Pulmonic Valve: The pulmonic valve was normal in structure. Pulmonic valve  regurgitation is trivial.   Aorta: The aortic root is normal in size and structure.   Venous: The inferior vena cava is normal in size with greater than 50%  respiratory variability, suggesting right atrial pressure of 3 mmHg.   IAS/Shunts: No atrial level shunt detected by color flow Doppler.     Assessment & Plan   1.  Chest discomfort, precordial chest pain-no chest pain today.  Patient describes episodes of chest discomfort that was sharp and nonexertional.  Reviewed ED diagnostics.  Does not appear to be cardiac related. No plans for ischemic evaluation  Elevated blood pressure readings-BP today 110/80.  Noted to be elevated during emergency department visit. Low-sodium diet Maintain blood pressure log  Sinus bradycardia-heart rate today 48.  Asymptomatic Continue to monitor  Disposition: Follow-up with Dr. Jacques Navy or me as needed.   Thomasene Ripple. Shantese Raven  NP-C     06/12/2022, 11:19 AM [facility] [address] Office [phone] Fax [phone]    I spent 13 minutes examining this patient, reviewing medications, and using patient centered shared decision making involving her cardiac care.  Prior to her visit I spent greater than 20 minutes reviewing her past medical history,  medications, and prior cardiac tests.

## 2022-06-12 ENCOUNTER — Encounter: Payer: Self-pay | Admitting: General Practice

## 2022-06-12 ENCOUNTER — Ambulatory Visit: Payer: No Typology Code available for payment source | Attending: General Practice | Admitting: General Practice

## 2022-06-12 VITALS — BP 110/80 | HR 48 | Ht 70.0 in | Wt 181.2 lb

## 2022-06-12 DIAGNOSIS — R030 Elevated blood-pressure reading, without diagnosis of hypertension: Secondary | ICD-10-CM | POA: Diagnosis not present

## 2022-06-12 DIAGNOSIS — R072 Precordial pain: Secondary | ICD-10-CM

## 2022-06-12 DIAGNOSIS — R0789 Other chest pain: Secondary | ICD-10-CM

## 2022-06-12 DIAGNOSIS — R001 Bradycardia, unspecified: Secondary | ICD-10-CM

## 2022-06-12 NOTE — Patient Instructions (Signed)
Medication Instructions:  The current medical regimen is effective;  continue present plan and medications as directed. Please refer to the Current Medication list given to you today.  *If you need a refill on your cardiac medications before your next appointment, please call your pharmacy*  Lab Work: NONE If you have labs (blood work) drawn today and your tests are completely normal, you will receive your results only by:  MyChart Message (if you have MyChart) OR  A paper copy in the mail If you have any lab test that is abnormal or we need to change your treatment, we will call you to review the results.  Testing/Procedures: NONE  Follow-Up: At [facility], you and your health needs are our priority.  As part of our continuing mission to provide you with exceptional heart care, we have created designated Provider Care Teams.  These Care Teams include your primary Cardiologist (physician) and Advanced Practice Providers (APPs -  Physician Assistants and Nurse Practitioners) who all work together to provide you with the care you need, when you need it.  Your next appointment:   AS NEEDED   Provider:   Parke Poisson, MD  or Edd Fabian, FNP        Other Instructions MAINTAIN YOUR PHYSICAL ACTIVITY

## 2022-11-27 ENCOUNTER — Emergency Department (HOSPITAL_COMMUNITY)
Admission: EM | Admit: 2022-11-27 | Discharge: 2022-11-27 | Disposition: A | Discharge status: Home / Self Care | Payer: No Typology Code available for payment source

## 2022-11-27 ENCOUNTER — Encounter (HOSPITAL_COMMUNITY): Payer: Self-pay

## 2022-11-27 ENCOUNTER — Other Ambulatory Visit: Payer: Self-pay

## 2022-11-27 ENCOUNTER — Ambulatory Visit (HOSPITAL_COMMUNITY)
Admission: EM | Admit: 2022-11-27 | Discharge: 2022-11-27 | Disposition: A | Discharge status: Home / Self Care | Payer: No Typology Code available for payment source | Attending: Emergency Medicine | Admitting: Emergency Medicine

## 2022-11-27 DIAGNOSIS — R42 Dizziness and giddiness: Secondary | ICD-10-CM | POA: Diagnosis present

## 2022-11-27 DIAGNOSIS — R110 Nausea: Secondary | ICD-10-CM | POA: Insufficient documentation

## 2022-11-27 LAB — BASIC METABOLIC PANEL
Anion gap: 8 (ref 5–15)
BUN: 8 mg/dL (ref 6–20)
CO2: 28 mmol/L (ref 22–32)
Calcium: 9.4 mg/dL (ref 8.9–10.3)
Chloride: 103 mmol/L (ref 98–111)
Creatinine, Ser: 0.75 mg/dL (ref 0.61–1.24)
GFR, Estimated: >60 mL/min (ref >60)
Glucose, Bld: 90 mg/dL (ref 70–99)
Potassium: 4 mmol/L (ref 3.5–5.1)
Sodium: 139 mmol/L (ref 135–145)

## 2022-11-27 LAB — URINALYSIS, ROUTINE W REFLEX MICROSCOPIC
Bilirubin Urine: NEGATIVE
Glucose, UA: NEGATIVE mg/dL
Hgb urine dipstick: NEGATIVE
Ketones, ur: NEGATIVE mg/dL
Leukocytes,Ua: NEGATIVE
Nitrite: NEGATIVE
Protein, ur: NEGATIVE mg/dL
Specific Gravity, Urine: 1.004 — ABNORMAL LOW (ref 1.005–1.030)
pH: 8 (ref 5.0–8.0)

## 2022-11-27 LAB — CBC
HCT: 48.8 % (ref 39.0–52.0)
Hemoglobin: 16.8 g/dL (ref 13.0–17.0)
MCH: 31.6 pg (ref 26.0–34.0)
MCHC: 34.4 g/dL (ref 30.0–36.0)
MCV: 91.9 fL (ref 80.0–100.0)
Platelets: 168 10*3/uL (ref 150–400)
RBC: 5.31 MIL/uL (ref 4.22–5.81)
RDW: 12.1 % (ref 11.5–15.5)
WBC: 5 10*3/uL (ref 4.0–10.5)
nRBC: 0 % (ref 0.0–0.2)

## 2022-11-27 LAB — POCT FASTING CBG KUC MANUAL ENTRY: POCT Glucose (KUC): 92 mg/dL (ref 70–99)

## 2022-11-27 MED ORDER — MECLIZINE HCL 25 MG PO TABS
25.0000 mg | ORAL_TABLET | Freq: Once | ORAL | Status: AC
  Administered 2022-11-27: 25 mg via ORAL
  Filled 2022-11-27: qty 1

## 2022-11-27 MED ORDER — ONDANSETRON 4 MG PO TBDP
4.0000 mg | ORAL_TABLET | Freq: Once | ORAL | Status: AC
  Administered 2022-11-27: 4 mg via ORAL
  Filled 2022-11-27: qty 1

## 2022-11-27 MED ORDER — ONDANSETRON HCL 4 MG PO TABS
4.0000 mg | ORAL_TABLET | Freq: Four times a day (QID) | ORAL | 0 refills | Status: AC
Start: 2022-11-27 — End: ?

## 2022-11-27 MED ORDER — MECLIZINE HCL 25 MG PO TABS: 25.0000 mg | ORAL_TABLET | Freq: Three times a day (TID) | ORAL | 0 refills | Status: AC | PRN

## 2022-11-27 NOTE — ED Notes (Signed)
Patient is being discharged from the Urgent Care and sent to the Emergency Department via POV . Per [place] Garrison,NP patient is in need of higher level of care due to new onset dizziness. Patient is aware and verbalizes understanding of plan of care.  Vitals:   11/27/22 0848  BP: (!) 152/96  Pulse: (!) 55  Resp: 16  Temp: 98.2 F (36.8 C)  SpO2: 95%

## 2022-11-27 NOTE — ED Triage Notes (Signed)
Patient states he woke at 0200 today and had to go to the bathroom. Patient states he was dizzy and had nausea. Patient also reports that when he tilts his head backwards he has immediate dizziness and nausea.  Patient was holding onto the walls when he entered this room.  Patient states he experienced an extremely loud noise to his left ear approx 2 months go and states he had a loud popping in his left ear and then couldn't hear for a few days. Patient states he has since gotten some of his hearing back to the elft ear.

## 2022-11-27 NOTE — ED Notes (Signed)
PT was up and ambulatory.  Pt stated he walked to the bathroom and feels much better. He asked for his discharge papers. Dr. Rhae Hammock notified via secure chat.

## 2022-11-27 NOTE — ED Provider Notes (Signed)
[place] [facility] DE[facility]MENT AT [facility] Provider Note   CSN: [id number] Arrival date & time: 11/27/22  0102     History  Chief Complaint  Patient presents with   Dizziness   Nausea    Steven Daugherty is a 52 y.o. male.  52 year old male with no reported past medical history presents the emergency department today with nausea and dizziness.  The patient states that he woke up at 2:30 AM with this.  He states he was feeling normal when he went to bed last night.  He denies any significant headache.  He states that he felt like he was falling to the left that time.  He states that this lasted till body 30 this morning.  His symptoms have improved now.  He did go to urgent care initially.  He was told to come here for further evaluation.  He denies any focal weakness, numbness, or tingling.   Dizziness      Home Medications Prior to Admission medications   Medication Sig Start Date End Date Taking? Authorizing Provider  meclizine (ANTIVERT) 25 MG tablet Take 1 tablet (25 mg total) by mouth 3 (three) times daily as needed for dizziness. 11/27/22  Yes Durwin Glaze, MD  ondansetron (ZOFRAN) 4 MG tablet Take 1 tablet (4 mg total) by mouth every 6 (six) hours. 11/27/22  Yes Durwin Glaze, MD  acetaminophen (TYLENOL) 500 MG tablet Take 500 mg by mouth daily as needed. Patient not taking: Reported on 06/12/2022    [provider]  bacitracin ointment Apply 1 Application topically 2 (two) times daily. Patient not taking: Reported on 06/12/2022 05/12/22   Achille Rich, PA-C  ibuprofen (ADVIL) 200 MG tablet Take 200 mg by mouth daily as needed for moderate pain. Patient not taking: Reported on 06/12/2022    [provider]      Allergies    Patient has no known allergies.    Review of Systems   Review of Systems  Neurological:  Positive for dizziness.  All other systems reviewed and are negative.   Physical Exam Updated Vital Signs BP (!) 148/94    Pulse (!) 54   Temp 98 F (36.7 C)   Resp 18   Ht 5' 10.5" (1.791 m)   Wt 81.6 kg   SpO2 96%   BMI 25.46 kg/m  Physical Exam Vitals and nursing note reviewed.   Gen: NAD Eyes: PERRL, EOMI HEENT: no oropharyngeal swelling Neck: trachea midline Resp: clear to auscultation bilaterally Card: RRR, no murmurs, rubs, or gallops Abd: nontender, nondistended Extremities: no calf tenderness, no edema Neuro: NIH stroke scale of 0 Vascular: 2+ radial pulses bilaterally, 2+ DP pulses bilaterally Skin: no rashes Psyc: acting appropriately   ED Results / Procedures / Treatments   Labs (all labs ordered are listed, but only abnormal results are displayed) Labs Reviewed  URINALYSIS, ROUTINE W REFLEX MICROSCOPIC - Abnormal; Notable for the following components:      Result Value   Color, Urine STRAW (*)    Specific Gravity, Urine 1.004 (*)    All other components within normal limits  BASIC METABOLIC PANEL  CBC  CBG MONITORING, ED    EKG None  Radiology No results found.  Procedures Procedures    Medications Ordered in ED Medications  meclizine (ANTIVERT) tablet 25 mg (25 mg Oral Given 11/27/22 1150)  ondansetron (ZOFRAN-ODT) disintegrating tablet 4 mg (4 mg Oral Given 11/27/22 1150)    ED Course/ Medical Decision Making/ A&P  Medical Decision Making 52 year old male with no reported past medical history presenting to the emergency department today with dizziness.  Patient's NIH stroke scale score is 0 here.  He is clearly outside the window for TNK at this time.  I will obtain basic labs here to eval for anemia or electrolyte abnormalities.  Will give the patient meclizine and Zofran for his symptoms in the event that this is due to vertigo.  I will reevaluate for ultimate disposition.  The patient's labs are reassuring.  His symptoms resolved with medications here.  I think that he is stable for discharge.  Is encouraged to return to the  ER for worsening symptoms.  Suspicion for CVA is low at this time given the resolution of his symptoms with medications here and based on description of his symptoms with reassuring neurologic exam.  Amount and/or Complexity of Data Reviewed Labs: ordered.  Risk Prescription drug management.           Final Clinical Impression(s) / ED Diagnoses Final diagnoses:  Dizziness    Rx / DC Orders ED Discharge Orders          Ordered    meclizine (ANTIVERT) 25 MG tablet  3 times daily PRN        11/27/22 1259    ondansetron (ZOFRAN) 4 MG tablet  Every 6 hours        11/27/22 1259              Durwin Glaze, MD 11/27/22 1300

## 2022-11-27 NOTE — Discharge Instructions (Signed)
I think that your symptoms today are from vertigo since they have resolved.  Please take the medications as prescribed for your symptoms.  Please call and schedule a follow-up appointment with your doctor.  If you have any new or worsening symptoms please return to the emergency department because dizziness especially with other symptoms could be associated with stroke.  Please return to the ER for worsening symptoms.

## 2022-11-27 NOTE — ED Triage Notes (Signed)
Pt. Stated, I started having dizziness last night around 200 this morning and continued even now. I went to UC and they sent me here. Im having some nausea no vomiting. Its a continuous dizziness even sitting down, everything is spinning.

## 2022-11-27 NOTE — ED Provider Notes (Signed)
[facility]    CSN: [id number] Arrival date & time: 11/27/22  4098      History   Chief Complaint Chief Complaint  Patient presents with   Dizziness   Nausea    HPI Steven Daugherty is a 52 y.o. male.   Patient went to bed around 10 PM last night with his wife.  Around 2:00 this morning he woke up and noticed that the room was spinning while he was laying in bed.  He got up to go to the bathroom and had trouble walking, noticed he was walking towards the left and he felt the room spinning.  When he tilts his head backwards he has immediate dizziness and nausea.  He had trouble ambulating in the clinic, had to hold onto the walls.  He denies any vision changes or weakness.  No numbness or tingling.  No chest pain or shortness of breath.  No history of vertigo.  Around 2 months ago he was at a concert and felt popping in his left ear.  Has not had it evaluated.  Has had no drainage.   The history is provided by the patient and medical records.  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 Authorizing Provider  acetaminophen (TYLENOL) 500 MG tablet Take 500 mg by mouth daily as needed. Patient not taking: Reported on 06/12/2022    [provider]  bacitracin ointment Apply 1 Application topically 2 (two) times daily. Patient not taking: Reported on 06/12/2022 05/12/22   Achille Rich, PA-C  ibuprofen (ADVIL) 200 MG tablet Take 200 mg by mouth daily as needed for moderate pain. Patient not taking: Reported on 06/12/2022    [provider]    Family History Family History  Problem Relation Age of Onset   Dementia Neg Hx     Social History Social History   Tobacco Use   Smoking status: Never   Smokeless tobacco: Never  Vaping Use   Vaping status: Never Used  Substance Use Topics   Alcohol use: Yes    Alcohol/week: 1.0 standard drink of alcohol    Types: 1 Standard drinks or equivalent per week    Comment: daily- 1 drink   Drug use: No     Allergies   Patient has no known allergies.   Review of Systems Review of Systems  Per HPI   Physical Exam Triage Vital Signs ED Triage Vitals [11/27/22 0848]  Encounter Vitals Group     BP (!) 152/96     Systolic BP Percentile      Diastolic BP Percentile      Pulse Rate (!) 55     Resp 16     Temp 98.2 F (36.8 C)     Temp Source Oral     SpO2 95 %  Weight      Height      Head Circumference      Peak Flow      Pain Score 0     Pain Loc      Pain Education      Exclude from Growth Chart    No data found.  Updated Vital Signs BP (!) 152/96 (BP Location: Left Arm)   Pulse (!) 55   Temp 98.2 F (36.8 C) (Oral)   Resp 16   SpO2 95%   Visual Acuity Right Eye Distance:   Left Eye Distance:   Bilateral Distance:    Right Eye Near:   Left Eye Near:    Bilateral Near:     Physical Exam Vitals and nursing note reviewed.  Constitutional:      Appearance: Normal appearance.  HENT:     Head: Normocephalic and atraumatic.     Right Ear: External ear normal.     Left Ear: External ear normal.     Nose: Nose normal.     Mouth/Throat:     Mouth: Mucous membranes are moist.  Eyes:     Extraocular Movements: Extraocular movements intact.     Conjunctiva/sclera: Conjunctivae normal.     Pupils: Pupils are equal, round, and reactive to light.  Cardiovascular:     Rate and  Rhythm: Regular rhythm. Bradycardia present.     Heart sounds: Normal heart sounds. No murmur heard. Pulmonary:     Effort: Pulmonary effort is normal. No respiratory distress.     Breath sounds: Normal breath sounds.  Musculoskeletal:        General: Normal range of motion.  Skin:    General: Skin is warm and dry.  Neurological:     General: No focal deficit present.     Mental Status: He is alert and oriented to person, place, and time.     Motor: No weakness.     Coordination: Coordination abnormal.     Gait: Gait abnormal.  Psychiatric:        Mood and Affect: Mood normal.      UC Treatments / Results  Labs (all labs ordered are listed, but only abnormal results are displayed) Labs Reviewed  POCT FASTING CBG [facility] MANUAL ENTRY    EKG   Radiology No results found.  Procedures Procedures (including critical care time)  Medications Ordered in UC Medications - No data to display  Initial Impression / Assessment and Plan / UC Course  I have reviewed the triage vital signs and the nursing notes.  Pertinent labs & imaging results that were available during my care of the patient were reviewed by me and considered in my medical decision making (see chart for details).  Vitals and triage reviewed, patient is hemodynamically stable.  Last known normal 10 PM last night.  Cranial nerves II through XII grossly intact.  Sinus bradycardia, asymptomatic with history of same.  EKG shows a rate of 52 bpm without ST elevation or ST depression.  Lungs are vesicular, heart with regular rate and rhythm. PERRLA, EOMI intact without dizziness.  Heel-to-shin intact, strength 5 out of 5 in upper and lower extremities.  Sensation intact as well. CBG was normal in clinic.  Patient with trouble ambulating.  No acute vision changes or vision loss.  Dizziness occurred while laying down and has been intermittent.  Discussed that he would benefit from further evaluation in [facility] emergency  department for potential head imaging.  Patient was driven here  by his wife, assisted out into the lobby and wife will drive down to [facility] via POV.     Final Clinical Impressions(s) / UC Diagnoses   Final diagnoses:  Dizziness   Discharge Instructions   None    ED Prescriptions   None    PDMP not reviewed this encounter.   Rinaldo Ratel [place] N, [place] 11/27/22 [phone]

## 2022-11-27 NOTE — ED Notes (Signed)
Pt stated that he was having dizziness and nausea.  Pt was explained what the two medications he was given were going to do.  Pt denied any recent trauma or illness.

## 2023-02-06 ENCOUNTER — Ambulatory Visit (HOSPITAL_COMMUNITY)
Admission: EM | Admit: 2023-02-06 | Discharge: 2023-02-06 | Disposition: A | Discharge status: Home / Self Care | Payer: No Typology Code available for payment source | Attending: Emergency Medicine | Admitting: Emergency Medicine

## 2023-02-06 ENCOUNTER — Encounter (HOSPITAL_COMMUNITY): Payer: Self-pay

## 2023-02-06 DIAGNOSIS — J069 Acute upper respiratory infection, unspecified: Secondary | ICD-10-CM

## 2023-02-06 DIAGNOSIS — R051 Acute cough: Secondary | ICD-10-CM

## 2023-02-06 MED ORDER — PROMETHAZINE-DM 6.25-15 MG/5ML PO SYRP: 5.0000 mL | ORAL_SOLUTION | Freq: Every evening | ORAL | 0 refills | Status: AC | PRN

## 2023-02-06 MED ORDER — AZITHROMYCIN 250 MG PO TABS: ORAL_TABLET | ORAL | 0 refills | Status: AC

## 2023-02-06 NOTE — ED Triage Notes (Signed)
Pt c/o cough and chest congestion since Saturday morning. States worse at night. States took OTC meds with no relief.

## 2023-02-06 NOTE — ED Provider Notes (Signed)
[facility]    CSN: [id number] Arrival date & time: 02/06/23  4401      History   Chief Complaint Chief Complaint  Patient presents with   Cough    HPI Damyan Cost is a 53 y.o. male.   Patient presents with cough and chest congestion since Saturday morning.  Patient states cough becomes worse at night.  Patient states that he is now coughing up green sputum.  Patient reports taking over-the-counter medications with no relief.  Denies shortness of breath, chest pain, and fever.   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 Authorizing Provider  azithromycin (ZITHROMAX Z-PAK) 250 MG tablet Take 2 pills (500mg ) first day and one pill (250mg ) the remaining 4 days. 02/06/23  Yes Wynonia Lawman A, NP  promethazine-dextromethorphan (PROMETHAZINE-DM) 6.25-15 MG/5ML syrup Take 5 mLs by mouth at bedtime as needed for cough. 02/06/23  Yes Susann Givens, Cornelia Walraven A, NP  acetaminophen (TYLENOL) 500 MG tablet Take 500 mg by mouth daily as needed. Patient not taking: Reported on 06/12/2022    [provider]  bacitracin ointment Apply 1 Application topically 2 (two) times daily. Patient not taking: Reported on 06/12/2022 05/12/22   Achille Rich, PA-C  ibuprofen (ADVIL) 200 MG tablet Take 200 mg by  mouth daily as needed for moderate pain. Patient not taking: Reported on 06/12/2022    [provider]  meclizine (ANTIVERT) 25 MG tablet Take 1 tablet (25 mg total) by mouth 3 (three) times daily as needed for dizziness. 11/27/22   Durwin Glaze, MD  ondansetron (ZOFRAN) 4 MG tablet Take 1 tablet (4 mg total) by mouth every 6 (six) hours. 11/27/22   Durwin Glaze, MD    Family History Family History  Problem Relation Age of Onset   Dementia Neg Hx     Social History Social History   Tobacco Use   Smoking status: Never   Smokeless tobacco: Never  Vaping Use   Vaping status: Never Used  Substance Use Topics   Alcohol use: Yes    Alcohol/week: 1.0 standard drink of alcohol    Types: 1 Standard drinks or equivalent per week    Comment: daily- 1 drink   Drug use: No     Allergies   Patient has no known allergies.   Review of Systems Review of Systems  Constitutional:  Negative for chills, fatigue and fever.  HENT:  Positive for congestion.   Respiratory:  Positive for cough. Negative for chest tightness, shortness of breath and wheezing.   Cardiovascular:  Negative for chest pain.     Physical Exam Triage Vital Signs ED Triage Vitals [02/06/23 0847]  Encounter Vitals Group     BP  138/89     Systolic BP Percentile      Diastolic BP Percentile      Pulse Rate 71     Resp 20     Temp 98.8 F (37.1 C)     Temp Source Oral     SpO2 95 %     Weight      Height      Head Circumference      Peak Flow      Pain Score 0     Pain Loc      Pain Education      Exclude from Growth Chart    No data found.  Updated Vital Signs BP 138/89 (BP Location: Left Arm)   Pulse 71   Temp 98.8 F (37.1 C) (Oral)   Resp 20   SpO2 95%   Visual Acuity Right Eye Distance:   Left Eye Distance:   Bilateral Distance:    Right Eye Near:   Left Eye Near:    Bilateral Near:     Physical Exam Vitals and nursing note reviewed.  Constitutional:      General: He is  awake. He is not in acute distress.    Appearance: Normal appearance. He is well-developed and well-groomed. He is not ill-appearing.  HENT:     Right Ear: Tympanic membrane, ear canal and external ear normal.     Left Ear: Tympanic membrane, ear canal and external ear normal.     Nose: Congestion and rhinorrhea present.     Mouth/Throat:     Mouth: Mucous membranes are moist.     Pharynx: Posterior oropharyngeal erythema and postnasal drip present. No oropharyngeal exudate.     Tonsils: No tonsillar exudate.  Cardiovascular:     Rate and Rhythm: Normal rate and regular rhythm.  Pulmonary:     Effort: Pulmonary effort is normal.     Breath sounds: Normal breath sounds.  Skin:    General: Skin is warm and dry.  Neurological:     Mental Status: He is alert.  Psychiatric:        Behavior: Behavior is cooperative.      UC Treatments / Results  Labs (all labs ordered are listed, but only abnormal results are displayed) Labs Reviewed - No data to display  EKG   Radiology No results found.  Procedures Procedures (including critical care time)  Medications Ordered in UC Medications - No data to display  Initial Impression / Assessment and Plan / UC Course  I have reviewed the triage vital signs and the nursing notes.  Pertinent labs & imaging results that were available during my care of the patient were reviewed by me and considered in my medical decision making (see chart for details).     Patient presented with 6-day history of cough and chest congestion.  Patient states that he is now coughing up green sputum.  Upon assessment congestion and rhinorrhea present, erythema and postnasal drip noted to pharynx.  Lungs clear bilaterally on auscultation.  Prescribed azithromycin for upper respiratory infection.  Prescribed promethazine DM as needed for cough at night.  Discussed return and ER precautions. Final Clinical Impressions(s) / UC Diagnoses   Final diagnoses:   Acute upper respiratory infection  Acute cough     Discharge Instructions      Start taking azithromycin today by taking 2 pills today and then 1 pill on the remaining 4 days.  I have prescribed some cough syrup that you can take at  night for cough.  This can make you drowsy so do not drive or work while taking.  Recommend taking Mucinex to help with cough and congestion.  You can alternate between Tylenol and ibuprofen as needed for body aches and fever.  If your symptoms persist or worsen you can return here for reevaluation.  If you develop severe shortness of breath, severe chest pain, fevers unrelieved by medication please seek medical treatment in the ER.    ED Prescriptions     Medication Sig Dispense Auth. Provider   azithromycin (ZITHROMAX Z-PAK) 250 MG tablet Take 2 pills (500mg ) first day and one pill (250mg ) the remaining 4 days. 6 tablet Wynonia Lawman A, NP   promethazine-dextromethorphan (PROMETHAZINE-DM) 6.25-15 MG/5ML syrup Take 5 mLs by mouth at bedtime as needed for cough. 118 mL Wynonia Lawman A, NP      PDMP not reviewed this encounter.   Wynonia Lawman A, NP 02/06/23 [phone]

## 2023-02-06 NOTE — Discharge Instructions (Signed)
Start taking azithromycin today by taking 2 pills today and then 1 pill on the remaining 4 days.  I have prescribed some cough syrup that you can take at night for cough.  This can make you drowsy so do not drive or work while taking.  Recommend taking Mucinex to help with cough and congestion.  You can alternate between Tylenol and ibuprofen as needed for body aches and fever.  If your symptoms persist or worsen you can return here for reevaluation.  If you develop severe shortness of breath, severe chest pain, fevers unrelieved by medication please seek medical treatment in the ER.

## 2023-06-18 DIAGNOSIS — R42 Dizziness and giddiness: Secondary | ICD-10-CM | POA: Diagnosis not present

## 2023-07-01 ENCOUNTER — Ambulatory Visit: Payer: Self-pay | Attending: Family Medicine | Admitting: Physical Therapy

## 2023-07-01 ENCOUNTER — Encounter: Payer: Self-pay | Admitting: Physical Therapy

## 2023-07-01 VITALS — BP 150/90 | HR 48

## 2023-07-01 DIAGNOSIS — R42 Dizziness and giddiness: Secondary | ICD-10-CM | POA: Diagnosis not present

## 2023-07-01 NOTE — Therapy (Signed)
 OUTPATIENT PHYSICAL THERAPY VESTIBULAR EVALUATION     Patient Name: Steven Daugherty MRN: [id number] DOB:1970/08/01, 53 y.o., male Today's Date: 07/01/2023  END OF SESSION:  PT End of Session - 07/01/23 0852     Visit Number 1    Number of Visits 5    Date for PT Re-Evaluation 07/31/23    Authorization Type BLUE CROSS BLUE [employer]IELD    PT Start Time 0850    PT Stop Time 0928    PT Time Calculation (min) 38 min    Activity Tolerance Patient tolerated treatment well    Behavior During Therapy [facility] for tasks assessed/performed          Past Medical History:  Diagnosis Date   Fatigue    Past Surgical History:  Procedure Laterality Date   FOOT SURGERY  2015   Arthritis   HERNIA REPAIR  2005   MOUTH SURGERY Right    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   WRIST SURGERY Left    Patient Active Problem List   Diagnosis Date Noted   Chest pain 05/27/2022   Elevated blood pressure reading 05/27/2022   Sinus bradycardia 05/26/2022   Sleep apnea with cognitive complaints 04/27/2021   Fatigue 12/26/2015   Cognitive complaints 12/26/2015    PCP: Rae Bugler, MD  REFERRING PROVIDER:  Charmaine Coop, DO  REFERRING DIAG: R42 (ICD-10-CM) - Dizziness and giddiness   THERAPY DIAG:  Dizziness and giddiness  ONSET DATE: 06/18/2023  Rationale for Evaluation and Treatment: Rehabilitation  SUBJECTIVE:   SUBJECTIVE STATEMENT: Reports vertigo starts in bed when changing sides. When it starts, it is hard for him to walk and has to hold on to the wall. One time had to hold on to the wall and couldn't drive. Notes it is very infrequent. Last episode was about 1.5 weeks ago, that happened 2 days in a row. These episodes started last November. Went to a concert last fall and it was a loud bang and reports ear was ringing really severely for a few weeks and then had an episode of spinning in bed. Prior to that, did not experience it. Still having slight ringing in his ears since then. Has not  seen an ENT, just a PCP. Episodes start when he is rolling in bed and then they continue throughout the morning for a few hours. Has not noticed anything that happens randomly during the day. Did the Epley maneuver and it seemed to help during the episodes. It seemed like they were getting less frequent, but then the dizziness seemed worse when it happened 2 days in a row.  Pt accompanied by: self  PERTINENT HISTORY: PMH: HLD  PAIN:  Are you having pain? No  Vitals:   07/01/23 0905  BP: (!) 150/90  Pulse: (!) 48   Pt reports he is normally bradycardic   PRECAUTIONS: None  FALLS: Has patient fallen in last 6 months? 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  DIAGNOSTIC FINDINGS: No imaging   COGNITION: Overall cognitive status: Within functional limits for tasks assessed   POSTURE:  No Significant postural limitations  Cervical ROM:  WNL  GAIT: Gait pattern: WFL Distance walked: Clinic distances  Assistive device utilized: None Level  of assistance: Complete Independence  VESTIBULAR ASSESSMENT:  GENERAL OBSERVATION: Ambulates in with no AD independently.    SYMPTOM BEHAVIOR:  Subjective history: See above.   Non-Vestibular symptoms: changes in hearing, tinnitus, and nausea/vomiting  Type of dizziness: Imbalance (Disequilibrium), Spinning/Vertigo, and when it starts the whole room starts spinning   Frequency: depends  Duration: last episode had a spinning sensation for 3 hours, other episodes lasted about an hour   Aggravating factors: Induced by position change: rolling to the right and rolling to the left  Relieving factors: Epley maneuver (performs on L side)    Progression of symptoms: pt initially reported episodes got less frequent, but then last 2 episodes had more symptoms   OCULOMOTOR EXAM:  Ocular Alignment: normal  Ocular ROM: No Limitations  Spontaneous Nystagmus: absent  Gaze-Induced Nystagmus: absent  Smooth Pursuits: intact  Saccades: intact   Pt reports having a stigmatism for a while and sometimes it is hard for his eyes to focus together   VESTIBULAR - OCULAR REFLEX:   Slow VOR: Normal  VOR Cancellation: Normal, pt reporting feeling a little dizzy   Head-Impulse Test: HIT Right: negative HIT Left: positive Pt reporting feeling slightly dizzy afterwards  Dynamic Visual Acuity: Static: Line 11 Dynamic: Line 4 Pt reporting some dizziness afterwards 7 line difference    POSITIONAL TESTING: Right Dix-Hallpike: no nystagmus and pt reporting slight dizziness with return to upright  Left Dix-Hallpike: no nystagmus Right Roll Test: no nystagmus Left Roll Test: no nystagmus Right Sidelying: no nystagmus Left Sidelying: no nystagmus and pt reporting feeling a very slight dizziness with return to upright, felt a slow moving   MOTION SENSITIVITY:  Motion Sensitivity Quotient Intensity: 0 = none, 1 = Lightheaded, 2 = Mild, 3 = Moderate, 4 = Severe, 5 = Vomiting  Intensity  1. Sitting to supine 0  2. Supine to L side   3. Supine to R side   4. Supine to sitting   5. L Hallpike-Dix 0  6. Up from L    7. R Hallpike-Dix 0  8. Up from R    9. Sitting, head tipped to L knee   10. Head up from L knee   11. Sitting, head tipped to R knee   12. Head up from R knee   13. Sitting head turns x5   14.Sitting head nods x5   15. In stance, 180 turn to L    16. In stance, 180 turn to R                                                                                                                                 TREATMENT DATE: 10/01/23  Self-Care:  Discussed pt negative for BPPV, educated on vestibular system and role of VOR,  educated on what PT will address in regards to VOR to see if it will help pt's dizziness  Attempted Kendall Pauls x2 reps due to slight  motion sensitivity with return to upright, performed with pt having no dizziness  Discussed will trial a bout of therapy and discussed referral to ENT if dizziness does not improve, and also discussed it would be beneficial to see an ENT also due to change in hearing/ringing in ears    PATIENT EDUCATION: Education details: Clinical findings, POC, See Self-Care  Person educated: Patient Education method: Explanation Education comprehension: verbalized understanding  HOME EXERCISE PROGRAM:  GOALS: Goals reviewed with patient? Yes  SHORT TERM GOALS: ALL STGS = LTGS   LONG TERM GOALS: Target date: 07/29/2023  Pt will be independent with final HEP for vestibular deficits in order to build upon functional gains made in therapy. Baseline:  Goal status: INITIAL  2.  SOT to be assessed with goal written.  Baseline:  Goal status: INITIAL  3.  Pt will improve DVA to a 4 line difference or less in order to demo improved VOR.  Baseline: 7 line difference  Goal status: INITIAL   ASSESSMENT:  CLINICAL IMPRESSION: Patient is a 53 year old male referred to Neuro OPPT for dizziness.   Pt's PMH is significant for: HLD. The following deficits were present during the exam: positive HIT to the L and 7 line difference with DVA indicating impaired VOR. Pt negative for positional testing with no nystagmus or dizziness. Pt did have some slight dizziness with return to upright from R DixHallpike and L sidelying. Attempted reps of Brandt Daroff, but pt did not have any symptoms when performing.  Pt with a vestibular hypofunction, unsure of exact cause as it did start after a concert when pt heard a loud noise standing closely to the speakers. Discussed a referral to ENT would also be beneficial. Pt would benefit from skilled PT to address these impairments and functional  limitations to maximize functional mobility independence and decr dizziness.    OBJECTIVE IMPAIRMENTS: decreased balance and dizziness.   ACTIVITY LIMITATIONS: bed mobility and locomotion level  PARTICIPATION LIMITATIONS: one time due to dizziness episode pt had to miss work   PERSONAL FACTORS: Time since onset of injury/illness/exacerbation and 1 comorbidity: HLD are also affecting patient's functional outcome.   REHAB POTENTIAL: Good  CLINICAL DECISION MAKING: Evolving/moderate complexity  EVALUATION COMPLEXITY: Moderate   PLAN:  PT FREQUENCY: 1x/week  PT DURATION: 4 weeks  PLANNED INTERVENTIONS: 97164- PT Re-evaluation, 97110-Therapeutic exercises, 97530- Therapeutic activity, 97112- Neuromuscular re-education, 97535- Self Care, 16109- Manual therapy, [phone]- Canalith repositioning, Patient/Family education, Balance training, and Vestibular training  PLAN FOR NEXT SESSION: assess SOT and write goal. Initial HEP for VOR and balance based on findings from SOT    Seabron Cypress, PT, DPT 07/01/2023, 10:56 AM

## 2023-07-08 ENCOUNTER — Encounter: Payer: Self-pay | Admitting: Physical Therapy

## 2023-07-08 NOTE — Therapy (Signed)
 [facility] [address] [place], [place], [address] Phone: [phone]   Fax:  [phone]  Patient Details  Name: Steven Daugherty MRN: [id number] Date of Birth: [birth year] Referring Provider:  No ref. provider found  Encounter Date: 07/08/2023   PHYSICAL THERAPY DISCHARGE SUMMARY  Visits from Start of Care: 1  Current functional level related to goals / functional outcomes: Did not get to check goals, pt present for eval only    Remaining deficits: Dizziness, impaired VOR    Education / Equipment: N/A    Patient agrees to discharge. Patient goals were not met - pt did not return after eval. Patient is being discharged due to pt's request. Pt called office and needing to cancel all appts due to losing his job.    [place] LOISE Senate, PT, DPT 07/08/2023, 11:10 AM  [facility] [address] [place], [place], [address] Phone: [phone]   Fax:  [phone]

## 2023-07-09 ENCOUNTER — Ambulatory Visit: Admitting: Physical Therapy

## 2023-07-16 ENCOUNTER — Encounter: Admitting: Physical Therapy

## 2023-07-25 ENCOUNTER — Encounter: Admitting: Physical Therapy

## 2023-07-30 ENCOUNTER — Encounter: Admitting: Physical Therapy

## 2023-08-11 DIAGNOSIS — K5730 Diverticulosis of large intestine without perforation or abscess without bleeding: Secondary | ICD-10-CM | POA: Diagnosis not present

## 2023-08-11 DIAGNOSIS — K648 Other hemorrhoids: Secondary | ICD-10-CM | POA: Diagnosis not present

## 2023-08-11 DIAGNOSIS — Z1211 Encounter for screening for malignant neoplasm of colon: Secondary | ICD-10-CM | POA: Diagnosis not present

## 2023-08-11 DIAGNOSIS — Z800 Family history of malignant neoplasm of digestive organs: Secondary | ICD-10-CM | POA: Diagnosis not present

## 2023-10-21 DIAGNOSIS — Z0000 Encounter for general adult medical examination without abnormal findings: Secondary | ICD-10-CM | POA: Diagnosis not present

## 2023-10-21 DIAGNOSIS — G4730 Sleep apnea, unspecified: Secondary | ICD-10-CM | POA: Diagnosis not present

## 2023-10-21 DIAGNOSIS — E7800 Pure hypercholesterolemia, unspecified: Secondary | ICD-10-CM | POA: Diagnosis not present

## 2023-10-21 DIAGNOSIS — N433 Hydrocele, unspecified: Secondary | ICD-10-CM | POA: Diagnosis not present

## 2023-10-21 DIAGNOSIS — Z23 Encounter for immunization: Secondary | ICD-10-CM | POA: Diagnosis not present

## 2023-10-21 DIAGNOSIS — Z125 Encounter for screening for malignant neoplasm of prostate: Secondary | ICD-10-CM | POA: Diagnosis not present

## 2023-11-11 DIAGNOSIS — Z23 Encounter for immunization: Secondary | ICD-10-CM | POA: Diagnosis not present

## 2023-12-16 DIAGNOSIS — N430 Encysted hydrocele: Secondary | ICD-10-CM | POA: Diagnosis not present

## 2023-12-16 DIAGNOSIS — N401 Enlarged prostate with lower urinary tract symptoms: Secondary | ICD-10-CM | POA: Diagnosis not present

## 2024-01-02 DIAGNOSIS — Z23 Encounter for immunization: Secondary | ICD-10-CM | POA: Diagnosis not present

## 2024-01-16 ENCOUNTER — Other Ambulatory Visit: Payer: Self-pay | Admitting: Urology

## 2024-01-27 ENCOUNTER — Encounter (HOSPITAL_COMMUNITY): Admission: RE | Payer: Self-pay | Source: Home / Self Care

## 2024-01-27 ENCOUNTER — Ambulatory Visit (HOSPITAL_COMMUNITY): Admission: RE | Admit: 2024-01-27 | Source: Home / Self Care | Admitting: Urology

## 2024-01-27 SURGERY — HYDROCELECTOMY
Anesthesia: General | Laterality: Bilateral
